# Patient Record
Sex: Male | Born: 1965
Health system: Southern US, Community
[De-identification: ages and names within clinical notes are randomized; demographics above are authoritative.]

## PROBLEM LIST (undated history)

## (undated) HISTORY — PX: CHOLECYSTECTOMY: SHX55

## (undated) HISTORY — PX: KNEE SURGERY: SHX244

## (undated) HISTORY — PX: RENAL BIOPSY, PERCUTANEOUS: SUR144

## (undated) HISTORY — PX: EYE SURGERY: SHX253

---

## 2005-02-28 ENCOUNTER — Encounter: Admission: RE | Admit: 2005-02-28 | Discharge: 2005-02-28 | Payer: Self-pay | Admitting: Family Medicine

## 2008-12-22 ENCOUNTER — Encounter: Admission: RE | Admit: 2008-12-22 | Discharge: 2008-12-22 | Payer: Self-pay | Admitting: Internal Medicine

## 2008-12-29 ENCOUNTER — Encounter: Payer: Self-pay | Admitting: Gastroenterology

## 2008-12-29 ENCOUNTER — Encounter: Admission: RE | Admit: 2008-12-29 | Discharge: 2008-12-29 | Payer: Self-pay | Admitting: Internal Medicine

## 2009-01-03 ENCOUNTER — Encounter: Payer: Self-pay | Admitting: Gastroenterology

## 2009-01-03 ENCOUNTER — Encounter: Admission: RE | Admit: 2009-01-03 | Discharge: 2009-01-03 | Payer: Self-pay | Admitting: Gastroenterology

## 2009-11-14 ENCOUNTER — Encounter: Payer: Self-pay | Admitting: Gastroenterology

## 2009-11-16 ENCOUNTER — Encounter: Admission: RE | Admit: 2009-11-16 | Discharge: 2009-11-16 | Payer: Self-pay | Admitting: Gastroenterology

## 2009-11-28 ENCOUNTER — Encounter: Payer: Self-pay | Admitting: Gastroenterology

## 2009-11-28 ENCOUNTER — Telehealth (INDEPENDENT_AMBULATORY_CARE_PROVIDER_SITE_OTHER): Payer: Self-pay | Admitting: *Deleted

## 2009-11-28 DIAGNOSIS — K861 Other chronic pancreatitis: Secondary | ICD-10-CM

## 2009-12-08 ENCOUNTER — Ambulatory Visit: Payer: Self-pay | Admitting: Gastroenterology

## 2009-12-08 ENCOUNTER — Ambulatory Visit (HOSPITAL_COMMUNITY): Admission: RE | Admit: 2009-12-08 | Discharge: 2009-12-08 | Payer: Self-pay | Admitting: Gastroenterology

## 2010-01-09 ENCOUNTER — Encounter: Payer: Self-pay | Admitting: Gastroenterology

## 2010-03-06 ENCOUNTER — Encounter: Payer: Self-pay | Admitting: Gastroenterology

## 2010-12-18 ENCOUNTER — Encounter: Payer: Self-pay | Admitting: Gastroenterology

## 2010-12-26 NOTE — Letter (Signed)
Summary: [facility]   Imported By: Sherian Rein 02/01/2010 15:15:15  _____________________________________________________________________  External Attachment:    Type:   Image     Comment:   External Document

## 2010-12-26 NOTE — Procedures (Signed)
Summary: Upper Endoscopy  Patient: Jonathan Joseph Note: All result statuses are Final unless otherwise noted.  Tests: (1) Upper Endoscopy (EGD)   EGD Upper Endoscopy       DONE     [facility]     [address] [place], [place]  [address]           ENDOSCOPY PROCEDURE REPORT           PATIENT:  Jonathan, Joseph  MR#:  [id number]     BIRTHDATE:  Mar 12, 1966, 43 yrs. old  GENDER:  male           ENDOSCOPIST:  Rachael Fee, MD     Referred by:  Danise Edge, M.D.           PROCEDURE DATE:  12/08/2009     PROCEDURE:  EGD with endoscopic ultrasound     ASA CLASS:  Class I     INDICATIONS:  recurrent acute pancreatitis; non-drinker, normal GB     on imaging, no stones on MRCP, MRI           MEDICATIONS:  Fentanyl 125 mcg IV, Versed 12.5 mg IV     TOPICAL ANESTHETIC:  none           DESCRIPTION OF PROCEDURE:   After the risks benefits and     alternatives of the procedure were thoroughly explained, informed     consent was obtained.  The  endoscope was introduced through the     mouth and advanced to the second portion of the duodenum, without     limitations.  The instrument was slowly withdrawn as the mucosa     was fully examined.     <<PROCEDUREIMAGES>>           Endoscopic findings (limited views with radial echoendoscope):     1. Normal esophagus     2. Normal stomach     3. Normal duodenum           EUS findings:     1. The pancreatic parenchyma was diffusely more hypoechoic than     usual (edematous) but there were no solid or cystic lesions noted.     2. Main pancreatic duct was normal, however this examination did     not rule out pancreatic divism.     3. CBD was normal, non-dilated and without stones     4. Gallbladder was normal.     5. No peripancreatic or celiac adenopathy.     6. Limited views of liver, spleen, portal and splenic vessels were     all normal.           Impression:     Diffusely hypoechoic pancreatic parenchyma (edematous  pancreas)     without focal solid or cystic lesions.  He carries diagnosis of     hypertriglyceridemia, but tells me that recent lipids were much     improved since he started OTC niacin.  Triglycerides at very high     levels (>1000) could cause pancreatitis.  I will send blood test     for autoimmune pancreatitis (IgG4 level) and if elevated, treat as     such.  If IgG4 is not elevated and triglycerides are felt not to     have caused pancreatitis, would consider cholecystectomy (perhaps     we are not seeing microlithiasis that could cause pancreatitis).     I will communicate these findings, IgG4 levels, recommendations  with Dr. Laural Benes.           ______________________________     Rachael Fee, MD           n.     eSIGNED:   Rachael Fee at 12/08/2009 12:31 PM           Georges Mouse, [id number]  Note: An exclamation mark (!) indicates a result that was not dispersed into the flowsheet. Document Creation Date: 12/08/2009 12:31 PM _______________________________________________________________________  (1) Order result status: Final Collection or observation date-time: 12/08/2009 12:18 Requested date-time:  Receipt date-time:  Reported date-time:  Referring Physician:   Ordering Physician: Rob Bunting [phone]) Specimen Source:  Source: Launa Grill Order Number: [id number] Lab site:

## 2010-12-26 NOTE — Letter (Signed)
Summary: EGD Instructions  [facility]  [address] [place], [place] [address]   Phone: [phone]  Fax: [phone]       Jonathan Joseph    [birth year]    MRN: [id number]       Procedure Day /Date:12/08/2009     Arrival Time: 11 am     Procedure Time:12:00 pm     Location of Procedure:                     [facility] ( Outpatient Registration)   PREPARATION FOR ENDOSCOPY   On 12/08/2009  THE DAY OF THE PROCEDURE:  1.   No solid foods, milk or milk products are allowed after midnight the night before your procedure.  2.   Do not drink anything colored red or purple.  Avoid juices with pulp.  No orange juice.  3.  You may drink clear liquids until 8 am , which is 4 hours before your procedure.                                                                                                CLEAR LIQUIDS INCLUDE: Water Jello Ice Popsicles Tea (sugar ok, no milk/cream) Powdered fruit flavored drinks Coffee (sugar ok, no milk/cream) Gatorade Juice: apple, white grape, white cranberry  Lemonade Clear bullion, consomm, broth Carbonated beverages (any kind) Strained chicken noodle soup Hard Candy   MEDICATION INSTRUCTIONS  Unless otherwise instructed, you should take regular prescription medications with a small sip of water as early as possible the morning of your procedure.  Diabetic patients - see separate instructions.             OTHER INSTRUCTIONS  You will need a responsible adult at least 43 years of age to accompany you and drive you home.   This person must remain in the waiting room during your procedure.  Wear loose fitting clothing that is easily removed.  Leave jewelry and other valuables at home.  However, you may wish to bring a book to read or an iPod/MP3 player to listen to music as you wait for your procedure to start.  Remove all body piercing jewelry and leave at home.  Total time from sign-in until discharge  is approximately 2-3 hours.  You should go home directly after your procedure and rest.  You can resume normal activities the day after your procedure.  The day of your procedure you should not:   Drive   Make legal decisions   Operate machinery   Drink alcohol   Return to work  You will receive specific instructions about eating, activities and medications before you leave.    The above instructions have been reviewed and explained to me by   Chales Abrahams CMA (AAMA)  November 28, 2009 10:02 AM     I fully understand and can verbalize these instructions over the phone mailed to [facility] 11/28/2009

## 2010-12-26 NOTE — Progress Notes (Signed)
Summary: EUS  Phone Note Outgoing Call Call back at [facility] Phone [phone]   Call placed by: Chales Abrahams CMA Duncan Dull),  November 28, 2009 9:59 AM Summary of Call: pt scheduled for EUS 12/08/2009  review meds   left message on machine to call back  Initial call taken by: Chales Abrahams CMA Duncan Dull),  November 28, 2009 10:03 AM  Follow-up for Phone Call        spoke with the pt he has the instructions and will call when he recieves them in the mail for any further questions. Follow-up by: Chales Abrahams CMA (AAMA),  November 28, 2009 11:09 AM  New Problems: CHRONIC PANCREATITIS (ICD-577.1)   New Problems: CHRONIC PANCREATITIS (ICD-577.1)

## 2010-12-26 NOTE — Letter (Signed)
Summary: [facility]   Imported By: Lester Bristol 11/30/2009 10:15:21  _____________________________________________________________________  External Attachment:    Type:   Image     Comment:   External Document

## 2010-12-26 NOTE — Letter (Signed)
Summary: [facility]   Imported By: Lester Wood-Ridge 03/29/2010 11:22:07  _____________________________________________________________________  External Attachment:    Type:   Image     Comment:   External Document

## 2013-12-21 ENCOUNTER — Encounter (HOSPITAL_COMMUNITY): Payer: Self-pay

## 2013-12-22 ENCOUNTER — Ambulatory Visit (HOSPITAL_COMMUNITY): Payer: BC Managed Care – PPO | Attending: Cardiology | Admitting: Radiology

## 2013-12-22 ENCOUNTER — Encounter: Payer: Self-pay | Admitting: Cardiology

## 2013-12-22 VITALS — BP 128/86 | HR 53 | Ht 71.0 in | Wt 174.0 lb

## 2013-12-22 DIAGNOSIS — R079 Chest pain, unspecified: Secondary | ICD-10-CM

## 2013-12-22 DIAGNOSIS — R5383 Other fatigue: Secondary | ICD-10-CM

## 2013-12-22 DIAGNOSIS — R0602 Shortness of breath: Secondary | ICD-10-CM

## 2013-12-22 DIAGNOSIS — R5381 Other malaise: Secondary | ICD-10-CM | POA: Insufficient documentation

## 2013-12-22 DIAGNOSIS — I498 Other specified cardiac arrhythmias: Secondary | ICD-10-CM | POA: Insufficient documentation

## 2013-12-22 DIAGNOSIS — R0989 Other specified symptoms and signs involving the circulatory and respiratory systems: Principal | ICD-10-CM | POA: Insufficient documentation

## 2013-12-22 DIAGNOSIS — R0609 Other forms of dyspnea: Secondary | ICD-10-CM | POA: Insufficient documentation

## 2013-12-22 MED ORDER — TECHNETIUM TC 99M SESTAMIBI GENERIC - CARDIOLITE
10.0000 | Freq: Once | INTRAVENOUS | Status: AC | PRN
  Administered 2013-12-22: 10 via INTRAVENOUS

## 2013-12-22 MED ORDER — TECHNETIUM TC 99M SESTAMIBI GENERIC - CARDIOLITE
30.0000 | Freq: Once | INTRAVENOUS | Status: AC | PRN
  Administered 2013-12-22: 30 via INTRAVENOUS

## 2013-12-22 NOTE — Progress Notes (Signed)
[facility] S[facility] NUCLE[facility] MED [address] [place], [place] [address] [phone]    Cardiology Nuclear Med Study  [name] is a 48 y.o. male     MRN : [id number]     DOB: 12/06/1965  Procedure Date: 12/22/2013  Nuclear Med Background Indication for Stress Test:  Evaluation for Ischemia History:  No known CAD, MPI 2012 (normal) Cardiac Risk Factors: Lipids  Symptoms:  DOE and Fatigue   Nuclear Pre-Procedure Caffeine/Decaff Intake:  None > 12 hrs NPO After: 8:30am   Lungs:  clear O2 Sat: 95% on room air. IV 0.9% NS with Angio Cath:  20g  IV Site: R Antecubital x 1, tolerated well IV Started by:  Irean HongPatsy Edwards, RN  Chest Size (in):  40 Cup Size: n/a  Height: 5\' 11"  (1.803 m)  Weight:  174 lb (78.926 kg)  BMI:  Body mass index is 24.28 kg/(m^2). Tech Comments:  N/A    Nuclear Med Study 1 or 2 day study: 1 day  Stress Test Type:  Stress  Reading MD: N/A  Order Authorizing Provider:  Marden Nobleobert Gates, MD  Resting Radionuclide: Technetium 4768m Sestamibi  Resting Radionuclide Dose: 11.0 mCi   Stress Radionuclide:  Technetium 6968m Sestamibi  Stress Radionuclide Dose: 33.0 mCi           Stress Protocol Rest HR: 53 Stress HR: 162  Rest BP: 128/86 Stress BP: 180/96  Exercise Time (min): 13:00 METS: 15.3           Dose of Adenosine (mg):  n/a Dose of Lexiscan: n/a mg  Dose of Atropine (mg): n/a Dose of Dobutamine: n/a mcg/kg/min (at max HR)  Stress Test Technologist: Broghan Pannone ChimesSharon Brooks, BS-ES  Nuclear Technologist:  Domenic PoliteStephen Carbone, CNMT     Rest Procedure:  Myocardial perfusion imaging was performed at rest 45 minutes following the intravenous administration of Technetium 7368m Sestamibi. Rest ECG: Sinus bradycardia, LVH  Stress Procedure:  The patient exercised on the treadmill utilizing the Bruce Protocol for 13:00 minutes. The patient stopped due to fatigue and denied any chest pain.  Technetium 1968m Sestamibi was injected at peak exercise and myocardial  perfusion imaging was performed after a brief delay. Stress ECG: No significant change from baseline ECG  QPS Raw Data Images:  Normal; no motion artifact; normal heart/lung ratio. Stress Images:  Normal homogeneous uptake in all areas of the myocardium. Rest Images:  Normal homogeneous uptake in all areas of the myocardium. Subtraction (SDS):  No evidence of ischemia. Transient Ischemic Dilatation (Normal <1.22):  0.95 Lung/Heart Ratio (Normal <0.45):  0.35  Quantitative Gated Spect Images QGS EDV:  107 ml QGS ESV:  48 ml  Impression Exercise Capacity:  Excellent exercise capacity. BP Response:  Normal blood pressure response. Clinical Symptoms:  No significant symptoms noted. ECG Impression:  No significant ST segment change suggestive of ischemia. Comparison with Prior Nuclear Study: No significant change from previous study  Overall Impression:  Normal stress nuclear study.  LV Ejection Fraction: 56%.  LV Wall Motion:  NL LV Function; NL Wall Motion  [name], Livia Tarr, Rexene EdisonH 12/22/2013

## 2015-08-11 ENCOUNTER — Other Ambulatory Visit: Payer: Self-pay | Admitting: Internal Medicine

## 2015-08-11 DIAGNOSIS — R1310 Dysphagia, unspecified: Secondary | ICD-10-CM

## 2015-08-15 ENCOUNTER — Ambulatory Visit
Admission: RE | Admit: 2015-08-15 | Discharge: 2015-08-15 | Disposition: A | Discharge status: Home / Self Care | Payer: BLUE CROSS/BLUE SHIELD | Source: Ambulatory Visit | Attending: Internal Medicine | Admitting: Internal Medicine

## 2015-08-15 DIAGNOSIS — R1310 Dysphagia, unspecified: Secondary | ICD-10-CM

## 2015-10-25 ENCOUNTER — Other Ambulatory Visit: Payer: Self-pay | Admitting: Internal Medicine

## 2015-10-25 ENCOUNTER — Ambulatory Visit
Admission: RE | Admit: 2015-10-25 | Discharge: 2015-10-25 | Disposition: A | Discharge status: Home / Self Care | Payer: BLUE CROSS/BLUE SHIELD | Source: Ambulatory Visit | Attending: Internal Medicine | Admitting: Internal Medicine

## 2015-10-25 DIAGNOSIS — M5489 Other dorsalgia: Secondary | ICD-10-CM

## 2015-11-14 ENCOUNTER — Ambulatory Visit
Admission: RE | Admit: 2015-11-14 | Discharge: 2015-11-14 | Disposition: A | Discharge status: Home / Self Care | Payer: BLUE CROSS/BLUE SHIELD | Source: Ambulatory Visit | Attending: Internal Medicine | Admitting: Internal Medicine

## 2015-11-14 ENCOUNTER — Other Ambulatory Visit: Payer: Self-pay | Admitting: Internal Medicine

## 2015-11-14 DIAGNOSIS — M545 Low back pain: Secondary | ICD-10-CM

## 2017-02-09 ENCOUNTER — Emergency Department (HOSPITAL_COMMUNITY)
Admission: EM | Admit: 2017-02-09 | Discharge: 2017-02-10 | Disposition: A | Discharge status: Home / Self Care | Payer: BLUE CROSS/BLUE SHIELD | Attending: Emergency Medicine | Admitting: Emergency Medicine

## 2017-02-09 ENCOUNTER — Encounter (HOSPITAL_COMMUNITY): Payer: Self-pay | Admitting: Emergency Medicine

## 2017-02-09 DIAGNOSIS — Y999 Unspecified external cause status: Secondary | ICD-10-CM | POA: Insufficient documentation

## 2017-02-09 DIAGNOSIS — S8012XA Contusion of left lower leg, initial encounter: Secondary | ICD-10-CM | POA: Insufficient documentation

## 2017-02-09 DIAGNOSIS — Y939 Activity, unspecified: Secondary | ICD-10-CM | POA: Diagnosis not present

## 2017-02-09 DIAGNOSIS — S299XXA Unspecified injury of thorax, initial encounter: Secondary | ICD-10-CM | POA: Diagnosis present

## 2017-02-09 DIAGNOSIS — S2221XA Fracture of manubrium, initial encounter for closed fracture: Secondary | ICD-10-CM | POA: Diagnosis not present

## 2017-02-09 DIAGNOSIS — R935 Abnormal findings on diagnostic imaging of other abdominal regions, including retroperitoneum: Secondary | ICD-10-CM | POA: Insufficient documentation

## 2017-02-09 DIAGNOSIS — Y92410 Unspecified street and highway as the place of occurrence of the external cause: Secondary | ICD-10-CM | POA: Insufficient documentation

## 2017-02-09 LAB — I-STAT CHEM 8, ED
BUN: 16 mg/dL (ref 6–20)
CREATININE: 1.4 mg/dL — AB (ref 0.61–1.24)
Calcium, Ion: 1.09 mmol/L — ABNORMAL LOW (ref 1.15–1.40)
Chloride: 105 mmol/L (ref 101–111)
GLUCOSE: 158 mg/dL — AB (ref 65–99)
HCT: 43 % (ref 39.0–52.0)
HEMOGLOBIN: 14.6 g/dL (ref 13.0–17.0)
POTASSIUM: 3.6 mmol/L (ref 3.5–5.1)
Sodium: 141 mmol/L (ref 135–145)
TCO2: 21 mmol/L (ref 0–100)

## 2017-02-09 MED ORDER — SODIUM CHLORIDE 0.9 % IV BOLUS (SEPSIS)
1000.0000 mL | Freq: Once | INTRAVENOUS | Status: AC
  Administered 2017-02-09: 1000 mL via INTRAVENOUS

## 2017-02-09 MED ORDER — IOPAMIDOL (ISOVUE-300) INJECTION 61%
INTRAVENOUS | Status: AC
  Administered 2017-02-10: 100 mL
  Filled 2017-02-09: qty 100
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
## 2017-02-10 ENCOUNTER — Emergency Department (HOSPITAL_COMMUNITY): Payer: BLUE CROSS/BLUE SHIELD

## 2017-02-10 ENCOUNTER — Encounter (HOSPITAL_COMMUNITY): Payer: Self-pay

## 2017-02-10 LAB — COMPREHENSIVE METABOLIC PANEL
ALBUMIN: 4.1 g/dL (ref 3.5–5.0)
ALT: 40 U/L (ref 17–63)
ANION GAP: 10 (ref 5–15)
AST: 44 U/L — AB (ref 15–41)
Alkaline Phosphatase: 67 U/L (ref 38–126)
BUN: 13 mg/dL (ref 6–20)
CHLORIDE: 106 mmol/L (ref 101–111)
CO2: 20 mmol/L — ABNORMAL LOW (ref 22–32)
CREATININE: 1.43 mg/dL — AB (ref 0.61–1.24)
Calcium: 9.2 mg/dL (ref 8.9–10.3)
GFR, EST NON AFRICAN AMERICAN: 57 mL/min — AB (ref >60)
Glucose, Bld: 158 mg/dL — ABNORMAL HIGH (ref 65–99)
POTASSIUM: 3.6 mmol/L (ref 3.5–5.1)
SODIUM: 136 mmol/L (ref 135–145)
Total Bilirubin: 1.1 mg/dL (ref 0.3–1.2)
Total Protein: 6.9 g/dL (ref 6.5–8.1)

## 2017-02-10 LAB — CBC
HEMATOCRIT: 42.1 % (ref 39.0–52.0)
Hemoglobin: 14.8 g/dL (ref 13.0–17.0)
MCH: 30.1 pg (ref 26.0–34.0)
MCHC: 35.2 g/dL (ref 30.0–36.0)
MCV: 85.6 fL (ref 78.0–100.0)
Platelets: 158 10*3/uL (ref 150–400)
RBC: 4.92 MIL/uL (ref 4.22–5.81)
RDW: 13.1 % (ref 11.5–15.5)
WBC: 11.7 10*3/uL — AB (ref 4.0–10.5)

## 2017-02-10 LAB — CBG MONITORING, ED: GLUCOSE-CAPILLARY: 121 mg/dL — AB (ref 65–99)

## 2017-02-10 MED ORDER — HYDROMORPHONE HCL 1 MG/ML IJ SOLN
1.0000 mg | Freq: Once | INTRAMUSCULAR | Status: AC
  Administered 2017-02-10: 1 mg via INTRAVENOUS
  Filled 2017-02-10: qty 1

## 2017-02-10 MED ORDER — HYDROCODONE-ACETAMINOPHEN 5-325 MG PO TABS
1.0000 | ORAL_TABLET | ORAL | 0 refills | Status: DC | PRN
Start: 2017-02-10 — End: 2017-03-07

## 2017-02-10 MED ORDER — HYDROCODONE-ACETAMINOPHEN 5-325 MG PO TABS
1.0000 | ORAL_TABLET | Freq: Once | ORAL | Status: AC
  Administered 2017-02-10: 1 via ORAL
  Filled 2017-02-10: qty 1

## 2017-02-10 MED ORDER — ONDANSETRON HCL 4 MG/2ML IJ SOLN
4.0000 mg | Freq: Once | INTRAMUSCULAR | Status: AC
Start: 2017-02-10 — End: 2017-02-10
  Administered 2017-02-10: 4 mg via INTRAVENOUS
  Filled 2017-02-10: qty 2

## 2017-02-10 NOTE — ED Notes (Signed)
Nurse tech went to sit pt up, pt became pale, pt was laid back flat. Blood sugar checked, will continue to monitor

## 2017-02-27 ENCOUNTER — Encounter: Payer: BLUE CROSS/BLUE SHIELD | Admitting: Cardiothoracic Surgery

## 2017-03-07 ENCOUNTER — Other Ambulatory Visit: Payer: Self-pay | Admitting: Cardiothoracic Surgery

## 2017-03-07 ENCOUNTER — Institutional Professional Consult (permissible substitution) (INDEPENDENT_AMBULATORY_CARE_PROVIDER_SITE_OTHER): Payer: BLUE CROSS/BLUE SHIELD | Admitting: Cardiothoracic Surgery

## 2017-03-07 ENCOUNTER — Encounter: Payer: Self-pay | Admitting: Cardiothoracic Surgery

## 2017-03-07 VITALS — BP 125/83 | HR 77 | Resp 18 | Ht 72.0 in | Wt 180.0 lb

## 2017-03-07 DIAGNOSIS — I712 Thoracic aortic aneurysm, without rupture, unspecified: Secondary | ICD-10-CM

## 2017-03-07 DIAGNOSIS — Q231 Congenital insufficiency of aortic valve: Secondary | ICD-10-CM

## 2017-03-07 NOTE — Patient Instructions (Signed)
Avoid Cipro or like antibiotics   It's best to avoid activities that cause grunting or straining (medically referred to as a "valsalva maneuver"). This happens when a person bears down against a closed throat to increase the strength of arm or abdominal muscles. There's often a tendency to do this when lifting heavy weights, doing sit-ups, push-ups or chin-ups, etc., but it may be harmful.     Thoracic Aortic Aneurysm An aneurysm is a bulge in an artery. It happens when blood pushes up against a weakened or damaged artery wall. A thoracic aortic aneurysm is an aneurysm that occurs in the first part of the aorta, between the heart and the diaphragm. The aorta is the main artery of the body. It supplies blood from the heart to the rest of the body. Some aneurysms may not cause symptoms or problems. However, the major concern with a thoracic aortic aneurysm is that it can enlarge and burst (rupture), or blood can flow between the layers of the wall of the aorta through a tear (aorticdissection). Both of these conditions can cause bleeding inside the body and can be life-threatening if they are not diagnosed and treated right away. What are the causes? The exact cause of this condition is not known. What increases the risk? The following factors may make you more likely to develop this condition:  Being age 32 or older.  Having a hardening of the arteries caused by the buildup of fat and other substances in the lining of a blood vessel (arteriosclerosis).  Having inflammation of the walls of an artery (arteritis).  Having a genetic disease that weakens the body's connective tissue, such as Marfan syndrome.  Having an injury or trauma to the aorta.  Having an infection that is caused by bacteria, such as syphilis or staphylococcus, in the wall of the aorta (infectious aortitis).  Having high blood pressure (hypertension).  Being male.  Being white (Caucasian).  Having high  cholesterol.  Having a family history of aneurysms.  Using tobacco.  Having chronic obstructive pulmonary disease (COPD). What are the signs or symptoms? Symptoms of this condition vary depending on the size and rate of growth of the aneurysm. Most grow slowly and do not cause any symptoms. When symptoms do occur, they may include:  Pain in the chest, back, sides, or abdomen. The pain may vary in intensity. A sudden onset of severe pain may indicate that the aneurysm has ruptured.  Hoarseness.  Cough.  Shortness of breath.  Swallowing problems.  Swelling in the face, arms, or legs.  Fever.  Unexplained weight loss. How is this diagnosed? This condition may be diagnosed with:  An ultrasound.  X-rays.  A CT scan.  An MRI.  Tests to check the arteries for damage or blockages (angiogram). Most unruptured thoracic aortic aneurysms cause no symptoms, so they are often found during exams for other conditions. How is this treated? Treatment for this condition depends on:  The size of the aneurysm.  How fast the aneurysm is growing.  Your age.  Risk factors for rupture. Aneurysms that are smaller than 2.2 inches (5.5 cm) may be managed by using medicines to control blood pressure, manage pain, or fight infection. You may need regular monitoring to see if the aneurysm is getting bigger. Your health care provider may recommend that you have an ultrasound every year or every 6 months. How often you need to have an ultrasound depends on the size of the aneurysm, how fast it is growing, and  whether you have a family history of aneurysms. Surgical repair may be needed if your aneurysm is larger than 2.2 inches or if it is growing quickly. Follow these instructions at home: Eating and drinking   Eat a healthy diet. Your health care provider may recommend that you:  Lower your salt (sodium) intake. In some people, too much salt can raise blood pressure and increase the risk of  thoracic aortic aneurysm.  Avoid foods that are high in saturated fat and cholesterol, such as red meat and dairy.  Eat a diet that is low in sugar.  Increase your fiber intake by including whole grains, vegetables, and fruits in your diet. Eating these foods may help to lower blood pressure.  Limit or avoid alcohol as recommended by your health care provider. Lifestyle   Follow instructions from your health care provider about healthy lifestyle habits. Your health care provider may recommend that you:  Do not use any products that contain nicotine or tobacco, such as cigarettes and e-cigarettes. If you need help quitting, ask your health care provider.  Keep your blood pressure within normal limits. The target limit for most people is below 120/80. Check your blood pressure regularly. If it is high, ask your health care provider about ways that you can control it.  Keep your blood sugar (glucose) level and cholesterol levels within normal limits. Target limits for most people are:  Blood glucose level: Less than 100 mg/dL.  Total cholesterol level: Less than 200 mg/dL.  Maintain a healthy weight. Activity   Stay physically active and exercise regularly. Talk with your health care provider about how often you should exercise and ask which types of exercise are safe for you.  Avoid heavy lifting and activities that take a lot of effort (are strenuous). Ask your health care provider what activities are safe for you. General instructions   Keep all follow-up visits as told by your health care provider. This is important.  Talk with your health care provider about regular screenings to see if the aneurysm is getting bigger.  Take over-the-counter and prescription medicines only as told by your health care provider. Contact a health care provider if:  You have discomfort in your upper back, neck, or abdomen.  You have trouble swallowing.  You have a cough or hoarseness.  You  have a family history of aneurysms.  You have unexplained weight loss. Get help right away if:  You have sudden, severe pain in your upper back and abdomen. This pain may move into your chest and arms.  You have shortness of breath.  You have a fever. This information is not intended to replace advice given to you by your health care provider. Make sure you discuss any questions you have with your health care provider. Document Released: 11/12/2005 Document Revised: 08/24/2016 Document Reviewed: 08/24/2016 Elsevier Interactive Patient Education  2017 [employer]   Aortic Dissection An aortic dissection happens when there is a tear in the main blood vessel of the body (aorta). The aorta comes out of the heart, curves around, and then goes down the chest (thoracic aorta) and into the abdomen (abdominal aorta) to supply arteries with blood. The wall of the aorta has inner and outer layers. Aortic dissection occurs most often in the thoracic aorta. As the tear widens and blood flows through it, the aorta becomes "double-barreled." This means that one part of the aorta continues to carry blood to the body, but blood also flows into the tear, between  the layers of the aorta. The torn part of the aorta fills with blood and swells up. This can reduce blood flow through the part of the aorta that is still supplying blood to the body. Aortic dissection is a medical emergency. What are the causes? An aortic dissection is commonly caused by weakening of the artery wall due to high blood pressure. Other causes may include:  An injury, such as from a car crash.  Birth defects that affect the heart (congenital heart defects).  Thickening of the artery walls. In some cases, the cause is not known. What increases the risk? The following factors may make you more likely to develop this condition:  Having certain medical conditions, such as:  High blood pressure (hypertension).  Hardening and  narrowing of the arteries (atherosclerosis).  A genetic disorder that affects the connective tissue, such as Marfan syndrome or Ehlers-Danlos syndrome.  A condition that causes inflammation of blood vessels, such as giant cell arteritis.  Having a chest injury.  Having surgery on the aorta.  Being born with a congenital heart defect.  Being male.  Being older than age 69.  Using cocaine.  Smoking.  Lifting heavy weights or doing other types of high-intensity resistance training. What are the signs or symptoms? Signs and symptoms of aortic dissection start suddenly. The most common symptoms are:  Severe chest pain that may feel like tearing, stabbing, or sharp pain.  Severe pain that spreads (radiates) to the back, neck, jaw, or abdomen. Other symptoms may include:  Trouble breathing.  Dizziness or fainting.  Sudden weakness on one side of the body.  Nausea or vomiting.  Trouble swallowing.  Coughing up blood.  Vomiting blood.  Clammy skin. How is this diagnosed? This condition may be diagnosed based on:  Your symptoms.  A physical exam. This may include:  Listening for abnormal blood flow sounds (murmurs) in your chest or abdomen.  Checking your pulse in your arms and legs.  Checking your blood pressure to see whether it is low or whether there is a difference between the measurements in your right and left arm.  Electrocardiogram (ECG). This test measures the electrical activity in your heart.  Chest X-ray.  CT scan.  MRI.  Aortic angiogram. This test involves injecting dye to make it easier to see your blood vessels clearly.  Echocardiogram to study your heart using sound waves.  Blood tests. How is this treated? It is important to treat an aortic dissection as quickly as possible. Treatment may start as soon as your health care provider thinks that you have aortic dissection. Treatment depends on the location and severity of your dissection  and your overall health. Treatment may include:  Medicines to lower your blood pressure.  Surgery to repair the dissected part of your aorta with artificial material (syntheticgraft).  A medical procedure to insert a stent-graft into the aorta (endovascular procedure). During this procedure, a long, thin tube (stent) is inserted into an artery near the groin (femoral artery) and moved up to the damaged part of the aorta. Then, the stent is opened to help improve blood flow and prevent future dissection. Follow these instructions at home: Activity   Avoid activities that could injure your chest or your abdomen. Ask your health care provider what activities are safe for you.  After you have recovered, try to stay active. Ask your health care provider what activities are safe for you after recovery.  Do not lift anything that is heavier than 10  lb (4.5 kg) until your health care provider approves.  Do not drive or use heavy machinery while taking prescription pain medicine. Eating and drinking   Eat a heart-healthy diet, which includes lots of fresh fruits and vegetables, low-fat (lean) protein, and whole grains.  Check ingredients and nutrition facts on packaged foods and beverages, and avoid foods with high amounts of:  Salt (sodium).  Saturated fats (like red meat).  Trans fats (like fried food). General instructions   Take over-the-counter and prescription medicines only as told by your health care provider.  Work with your health care provider to manage your blood pressure.  Talk with your health care provider about how to manage stress.  Do not use any products that contain nicotine or tobacco, such as cigarettes and e-cigarettes. If you need help quitting, ask your health care provider.  Keep all follow-up visits as told by your health care provider. This is important. Get help right away if:  You develop any symptoms of aortic dissection after treatment, including severe  pain in your chest, back, or abdomen.  You have a pain in your abdomen.  You have trouble breathing or you develop a cough.  You faint.  You develop a racing heartbeat. These symptoms may represent a serious problem that is an emergency. Do not wait to see if the symptoms will go away. Get medical help right away. Call your local emergency services (911 in the [place] Do not drive yourself to the hospital. Summary  An aortic dissection happens when there is a tear in the main blood vessel of the body (aorta). It is a medical emergency.  The most common symptom is severe pain in the chest that spreads (radiates) to the back, neck, jaw, or abdomen.  It is important to treat an aortic dissection as quickly as possible. Treatment typically includes surgery and medicines. This information is not intended to replace advice given to you by your health care provider. Make sure you discuss any questions you have with your health care provider. Document Released: 02/19/2008 Document Revised: 10/01/2016 Document Reviewed: 10/01/2016 Elsevier Interactive Patient Education  2017 [employer].
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
## 2017-03-11 ENCOUNTER — Encounter: Payer: Self-pay | Admitting: Cardiothoracic Surgery

## 2017-03-14 ENCOUNTER — Other Ambulatory Visit (HOSPITAL_COMMUNITY): Payer: BLUE CROSS/BLUE SHIELD

## 2017-03-19 ENCOUNTER — Ambulatory Visit (HOSPITAL_COMMUNITY)
Admission: RE | Admit: 2017-03-19 | Discharge: 2017-03-19 | Disposition: A | Discharge status: Home / Self Care | Payer: BLUE CROSS/BLUE SHIELD | Source: Ambulatory Visit | Attending: Cardiothoracic Surgery | Admitting: Cardiothoracic Surgery

## 2017-03-19 DIAGNOSIS — Q231 Congenital insufficiency of aortic valve: Secondary | ICD-10-CM | POA: Diagnosis not present

## 2017-03-19 LAB — ECHOCARDIOGRAM COMPLETE
E decel time: 290 msec
E/e' ratio: 7.52
FS: 33 % (ref 28–44)
IVS/LV PW RATIO, ED: 0.87
LA ID, A-P, ES: 35 mm
LA diam end sys: 35 mm
LA diam index: 1.72 cm/m2
LA vol A4C: 39.4 ml
LA vol index: 21 mL/m2
LA vol: 42.8 mL
LV E/e' medial: 7.52
LV E/e'average: 7.52
LV PW d: 10.8 mm — AB (ref 0.6–1.1)
LV e' LATERAL: 8.38 cm/s
LVOT SV: 74 mL
LVOT VTI: 21.3 cm
LVOT area: 3.46 cm2
LVOT diameter: 21 mm
LVOT peak grad rest: 3 mmHg
LVOT peak vel: 90.1 cm/s
Lateral S' vel: 14.8 cm/s
MV Dec: 290
MV pk A vel: 59.6 m/s
MV pk E vel: 63 m/s
TAPSE: 24.4 mm
TDI e' lateral: 8.38
TDI e' medial: 7.02

## 2017-03-19 NOTE — Progress Notes (Signed)
  Echocardiogram 2D Echocardiogram has been performed.  Jonathan Joseph 03/19/2017, 8:32 AM

## 2017-12-27 ENCOUNTER — Other Ambulatory Visit: Payer: Self-pay | Admitting: Internal Medicine

## 2017-12-27 ENCOUNTER — Ambulatory Visit
Admission: RE | Admit: 2017-12-27 | Discharge: 2017-12-27 | Disposition: A | Discharge status: Home / Self Care | Payer: BLUE CROSS/BLUE SHIELD | Source: Ambulatory Visit | Attending: Internal Medicine | Admitting: Internal Medicine

## 2017-12-27 DIAGNOSIS — R635 Abnormal weight gain: Secondary | ICD-10-CM | POA: Diagnosis not present

## 2017-12-27 DIAGNOSIS — J40 Bronchitis, not specified as acute or chronic: Secondary | ICD-10-CM

## 2017-12-27 DIAGNOSIS — J209 Acute bronchitis, unspecified: Secondary | ICD-10-CM | POA: Diagnosis not present

## 2017-12-27 DIAGNOSIS — R05 Cough: Secondary | ICD-10-CM | POA: Diagnosis not present

## 2018-01-28 ENCOUNTER — Encounter: Payer: Self-pay | Admitting: Podiatry

## 2018-01-28 ENCOUNTER — Ambulatory Visit (INDEPENDENT_AMBULATORY_CARE_PROVIDER_SITE_OTHER): Payer: BLUE CROSS/BLUE SHIELD

## 2018-01-28 ENCOUNTER — Ambulatory Visit: Payer: BLUE CROSS/BLUE SHIELD | Admitting: Podiatry

## 2018-01-28 VITALS — Ht 72.0 in | Wt 195.0 lb

## 2018-01-28 DIAGNOSIS — M79672 Pain in left foot: Secondary | ICD-10-CM

## 2018-01-28 DIAGNOSIS — M722 Plantar fascial fibromatosis: Secondary | ICD-10-CM

## 2018-01-28 MED ORDER — MELOXICAM 15 MG PO TABS: 15.0000 mg | ORAL_TABLET | Freq: Every day | ORAL | 0 refills | Status: DC

## 2018-01-28 NOTE — Progress Notes (Signed)
Subjective:    Patient ID: Jonathan Joseph, male    DOB: [birth year], 52 y.o.   MRN: [id number]  HPI  Chief Complaint  Patient presents with  . Foot Pain    Left heel pain, acute x 1 month -    Jonathan Joseph presents the office today with concerns of left heel pain which is been ongoing for about 2 months.  He states this started without obvious keep foods.  He denies any recent injury or trauma when the heel started there was a gradual onset.  He denies any swelling or redness.  He states he gets pain in the morning to the bottom of his heel when he first gets up and gets better with walking.  He has tried over-the-counter inserts, change in shoes as well as icing.  He also has a night splint at home.  He has no numbness or tingling in the pain does not wake him up at night.  He has no other concerns today.    Review of Systems  Musculoskeletal: Positive for gait problem.  All other systems reviewed and are negative.  History reviewed. No pertinent past medical history.  Past Surgical History:  Procedure Laterality Date  . CHOLECYSTECTOMY    . EYE SURGERY    . KNEE SURGERY    . RENAL BIOPSY, PERCUTANEOUS       Current Outpatient Medications:  .  aspirin EC 81 MG tablet, Take 81 mg by mouth daily., Disp: , Rfl:  .  esomeprazole (NEXIUM) 20 MG capsule, Take 20 mg by mouth. EVERY OTHER DAY, Disp: , Rfl:  .  loratadine (CLARITIN) 10 MG tablet, Take 10 mg by mouth daily., Disp: , Rfl:  .  meloxicam (MOBIC) 15 MG tablet, Take 1 tablet (15 mg total) by mouth daily., Disp: 30 tablet, Rfl: 0 .  rosuvastatin (CRESTOR) 10 MG tablet, Take 10 mg by mouth. EVERY OTHER DAY, Disp: , Rfl:   Allergies  Allergen Reactions  . Penicillins Hives  . Tetracyclines & Related Hives    Social History   Socioeconomic History  . Marital status: Married    Spouse name: Not on file  . Number of children: Not on file  . Years of education: Not on file  . Highest education level: Not on file  Social  Needs  . Financial resource strain: Not on file  . Food insecurity - worry: Not on file  . Food insecurity - inability: Not on file  . Transportation needs - medical: Not on file  . Transportation needs - non-medical: Not on file  Occupational History  . Not on file  Tobacco Use  . Smoking status: Never Smoker  . Smokeless tobacco: Never Used  Substance and Sexual Activity  . Alcohol use: No  . Drug use: No  . Sexual activity: Not on file  Other Topics Concern  . Not on file  Social History Narrative  . Not on file       Objective:   Physical Exam General: AAO x3, NAD  Dermatological: Skin is warm, dry and supple bilateral. Nails x 10 are well manicured; remaining integument appears unremarkable at this time. There are no open sores, no preulcerative lesions, no rash or signs of infection present.  Vascular: Dorsalis Pedis artery and Posterior Tibial artery pedal pulses are 2/4 bilateral with immedate capillary fill time. Pedal hair growth present. No varicosities and no lower extremity edema present bilateral. There is no pain with calf compression, swelling, warmth, erythema.  Neruologic: Grossly intact via light touch bilateral. Protective threshold with Semmes Wienstein monofilament intact to all pedal sites bilateral.  Negative Tinel sign  Musculoskeletal: Tenderness to palpation along the plantar medial tubercle of the calcaneus at the insertion of plantar fascia on the left foot. There is no pain along the course of the plantar fascia within the arch of the foot. Plantar fascia appears to be intact. There is no pain with lateral compression of the calcaneus or pain with vibratory sensation. There is no pain along the course or insertion of the achilles tendon. No other areas of tenderness to bilateral lower extremities. Muscular strength 5/5 in all groups tested bilateral.  Gait: Unassisted, Nonantalgic.     Assessment & Plan:  52 year old male left heel pain likely  plantar fasciitis -Treatment options discussed including all alternatives, risks, and complications -Etiology of symptoms were discussed -X-rays were obtained and reviewed with the patient.  There is no evidence of acute fracture or stress fracture identified today. -Steroid injections performed today.  See procedure note below. -Prescribed mobic. Discussed side effects of the medication and directed to stop if any are to occur and call the office.  -Plantar fascial brace dispensed  -Stretching, icing exercises daily. -Continue shoe modifications and inserts -Follow-up in 3 weeks or sooner if any issues are to arise.  Call any questions or concerns.  He agrees with this plan.  Procedure: Injection Tendon/Ligament Discussed alternatives, risks, complications and verbal consent was obtained.  Location: Left plantar fascia at the glabrous junction; medial approach. Skin Prep: Alcohol. Injectate: 0.5 cc 0.5% marcaine plain, 0.5 cc 0.5% Marcaine plain and, 1 cc kenalog 10. Disposition: Patient tolerated procedure well. Injection site dressed with a band-aid.  Post-injection care was discussed and return precautions discussed.   Jonathan Joseph DPM

## 2018-01-28 NOTE — Patient Instructions (Signed)

## 2018-01-30 DIAGNOSIS — M722 Plantar fascial fibromatosis: Secondary | ICD-10-CM | POA: Insufficient documentation

## 2018-02-07 DIAGNOSIS — G933 Postviral fatigue syndrome: Secondary | ICD-10-CM | POA: Diagnosis not present

## 2018-02-07 DIAGNOSIS — R05 Cough: Secondary | ICD-10-CM | POA: Diagnosis not present

## 2018-02-13 ENCOUNTER — Other Ambulatory Visit: Payer: Self-pay | Admitting: Cardiothoracic Surgery

## 2018-02-13 DIAGNOSIS — I712 Thoracic aortic aneurysm, without rupture, unspecified: Secondary | ICD-10-CM

## 2018-02-24 ENCOUNTER — Other Ambulatory Visit: Payer: Self-pay | Admitting: Podiatry

## 2018-02-24 NOTE — Telephone Encounter (Signed)
Pt needs an appt prior to future refills. 

## 2018-03-08 DIAGNOSIS — J302 Other seasonal allergic rhinitis: Secondary | ICD-10-CM | POA: Diagnosis not present

## 2018-03-08 DIAGNOSIS — I10 Essential (primary) hypertension: Secondary | ICD-10-CM | POA: Diagnosis not present

## 2018-03-11 DIAGNOSIS — K210 Gastro-esophageal reflux disease with esophagitis: Secondary | ICD-10-CM | POA: Diagnosis not present

## 2018-03-11 DIAGNOSIS — I712 Thoracic aortic aneurysm, without rupture: Secondary | ICD-10-CM | POA: Diagnosis not present

## 2018-03-11 DIAGNOSIS — I10 Essential (primary) hypertension: Secondary | ICD-10-CM | POA: Diagnosis not present

## 2018-03-26 ENCOUNTER — Ambulatory Visit: Payer: BLUE CROSS/BLUE SHIELD | Admitting: Podiatry

## 2018-03-26 ENCOUNTER — Encounter: Payer: Self-pay | Admitting: Podiatry

## 2018-03-26 DIAGNOSIS — M722 Plantar fascial fibromatosis: Secondary | ICD-10-CM | POA: Diagnosis not present

## 2018-03-26 MED ORDER — METHYLPREDNISOLONE 4 MG PO TBPK: ORAL_TABLET | ORAL | 0 refills | Status: DC

## 2018-03-27 ENCOUNTER — Other Ambulatory Visit: Payer: Self-pay | Admitting: Podiatry

## 2018-03-30 NOTE — Progress Notes (Signed)
Subjective: Jonathan Joseph presents the office today for follow-up evaluation of left heel pain, plantar fasciitis.  He states the pain on the inside is doing much better but is getting pain more to the bottom outside aspect of the heel.  He denies any recent injury or trauma and he has been trying to stretch and ice as much as possible.  No swelling or redness.  He has no other concerns. Denies any systemic complaints such as fevers, chills, nausea, vomiting. No acute changes since last appointment, and no other complaints at this time.   Objective: AAO x3, NAD DP/PT pulses palpable bilaterally, CRT less than 3 seconds There is tenderness to palpation along the plantar LATERAL tubercle of the calcaneus at the insertion of plantar fascia on the left foot. There is no pain along the course of the plantar fascia within the arch of the foot. Plantar fascia appears to be intact. There is no pain with lateral compression of the calcaneus or pain with vibratory sensation. There is no pain along the course or insertion of the achilles tendon. No other areas of tenderness to bilateral lower extremities. No open lesions or pre-ulcerative lesions.  No pain with calf compression, swelling, warmth, erythema  Assessment: Left heel pain, plantar fasciitis  Plan: -All treatment options discussed with the patient including all alternatives, risks, complications.  -Today steroid injections performed in the lateral approach.  See procedure note below. -Medrol Dosepak was prescribed -Continue stretching, icing exercises daily as well as supportive shoes discussed orthotics -Patient encouraged to call the office with any questions, concerns, change in symptoms.  -RTC 3 weeks if symptoms continue or sooner if needed  Procedure: Injection Tendon/Ligament Discussed alternatives, risks, complications and verbal consent was obtained.  Location: Left plantar fascia at the glabrous junction; LATERAL approach. Skin Prep:  Alcohol. Injectate: 0.5cc 0.5% marcaine plain, 0.5 cc 2% lidocaine plain and, 1 cc kenalog 10. Disposition: Patient tolerated procedure well. Injection site dressed with a band-aid.  Post-injection care was discussed and return precautions discussed.    Vivi Barrack DPM

## 2018-04-03 ENCOUNTER — Ambulatory Visit: Payer: BLUE CROSS/BLUE SHIELD | Admitting: Cardiothoracic Surgery

## 2018-04-03 ENCOUNTER — Other Ambulatory Visit: Payer: Self-pay

## 2018-04-03 ENCOUNTER — Encounter: Payer: Self-pay | Admitting: Cardiothoracic Surgery

## 2018-04-03 ENCOUNTER — Ambulatory Visit
Admission: RE | Admit: 2018-04-03 | Discharge: 2018-04-03 | Disposition: A | Discharge status: Home / Self Care | Payer: BLUE CROSS/BLUE SHIELD | Source: Ambulatory Visit | Attending: Cardiothoracic Surgery | Admitting: Cardiothoracic Surgery

## 2018-04-03 VITALS — BP 119/88 | HR 77 | Resp 18 | Ht 72.0 in | Wt 195.6 lb

## 2018-04-03 DIAGNOSIS — I712 Thoracic aortic aneurysm, without rupture, unspecified: Secondary | ICD-10-CM

## 2018-04-03 DIAGNOSIS — Q231 Congenital insufficiency of aortic valve: Secondary | ICD-10-CM | POA: Diagnosis not present

## 2018-04-03 DIAGNOSIS — I719 Aortic aneurysm of unspecified site, without rupture: Secondary | ICD-10-CM | POA: Insufficient documentation

## 2018-04-03 MED ORDER — IOPAMIDOL (ISOVUE-370) INJECTION 76%
75.0000 mL | Freq: Once | INTRAVENOUS | Status: AC | PRN
  Administered 2018-04-03: 75 mL via INTRAVENOUS

## 2018-04-03 NOTE — Patient Instructions (Signed)
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
## 2018-04-10 DIAGNOSIS — G478 Other sleep disorders: Secondary | ICD-10-CM | POA: Diagnosis not present

## 2018-04-10 DIAGNOSIS — I712 Thoracic aortic aneurysm, without rupture: Secondary | ICD-10-CM | POA: Diagnosis not present

## 2018-04-10 DIAGNOSIS — R5383 Other fatigue: Secondary | ICD-10-CM | POA: Diagnosis not present

## 2018-04-10 DIAGNOSIS — I10 Essential (primary) hypertension: Secondary | ICD-10-CM | POA: Diagnosis not present

## 2018-04-10 DIAGNOSIS — Z79899 Other long term (current) drug therapy: Secondary | ICD-10-CM | POA: Diagnosis not present

## 2018-05-01 DIAGNOSIS — G4733 Obstructive sleep apnea (adult) (pediatric): Secondary | ICD-10-CM | POA: Diagnosis not present

## 2018-05-12 ENCOUNTER — Encounter (INDEPENDENT_AMBULATORY_CARE_PROVIDER_SITE_OTHER): Payer: Self-pay

## 2018-05-12 ENCOUNTER — Encounter: Payer: Self-pay | Admitting: Podiatry

## 2018-05-12 ENCOUNTER — Encounter

## 2018-05-12 ENCOUNTER — Ambulatory Visit: Payer: BLUE CROSS/BLUE SHIELD | Admitting: Podiatry

## 2018-05-12 DIAGNOSIS — M722 Plantar fascial fibromatosis: Secondary | ICD-10-CM

## 2018-05-12 MED ORDER — TRIAMCINOLONE ACETONIDE 10 MG/ML IJ SUSP
10.0000 mg | Freq: Once | INTRAMUSCULAR | Status: AC
  Administered 2021-12-13: 10 mg

## 2018-05-13 DIAGNOSIS — G4733 Obstructive sleep apnea (adult) (pediatric): Secondary | ICD-10-CM | POA: Diagnosis not present

## 2018-05-13 NOTE — Progress Notes (Signed)
Subjective: 52 year old male presents the office today for evaluation of the left heel pain, plantar fasciitis.  He states that he did not take the Medrol Dosepak.  Given his history of kidney problems he only took the anti-inflammatories for 1 week he has not been taking any anti-inflammatories.  He does feel that the injections to help.  Denies any recent injury or trauma.  He is interested in dry needling because he talked to a friend at this time it was helpful. Denies any systemic complaints such as fevers, chills, nausea, vomiting. No acute changes since last appointment, and no other complaints at this time.   Objective: AAO x3, NAD DP/PT pulses palpable bilaterally, CRT less than 3 seconds There is continuation of tenderness on the plantar medial tubercle of the calcaneus and insertion of plantar fashion the left side.  Also mild discomfort on the plantar lateral aspect.  There is no pain with lateral compression of calcaneus.  No edema, erythema, increase in warmth.  There is no pain on the Achilles tendon which also appears to be intact.  Plantar fascia appears to be intact.  No open lesions or pre-ulcerative lesions.  No pain with calf compression, swelling, warmth, erythema  Assessment: 52 year old male with left heel pain  Plan: -All treatment options discussed with the patient including all alternatives, risks, complications.  -Today steroid injection was performed.  This is his 3rd steroid injection. -Prescription for physical therapy was written for [facility] to also include dry needling. -Technique stretching, icing exercises daily.  Continue with night splint.  Continue with supportive shoes and also discussed orthotics again today. -Patient encouraged to call the office with any questions, concerns, change in symptoms.   Procedure: Injection Tendon/Ligament Discussed alternatives, risks, complications and verbal consent was obtained.  Location: Right plantar  fascia at the glabrous junction; medial approach. Skin Prep: Alcohol. Injectate: 0.5cc 0.5% marcaine plain, 0.5 cc 2% lidocaine plain and, 1 cc kenalog 10. Disposition: Patient tolerated procedure well. Injection site dressed with a band-aid.  Post-injection care was discussed and return precautions discussed.   Vivi BarrackMatthew R Wagoner DPM

## 2018-05-13 NOTE — Addendum Note (Signed)
Addended by: Alphia Kava'CONNELL, VALERY D on: 05/13/2018 09:23 AM   Modules accepted: Orders

## 2018-05-15 DIAGNOSIS — I712 Thoracic aortic aneurysm, without rupture: Secondary | ICD-10-CM | POA: Diagnosis not present

## 2018-05-15 DIAGNOSIS — I10 Essential (primary) hypertension: Secondary | ICD-10-CM | POA: Diagnosis not present

## 2018-05-15 DIAGNOSIS — G4733 Obstructive sleep apnea (adult) (pediatric): Secondary | ICD-10-CM | POA: Diagnosis not present

## 2018-05-16 DIAGNOSIS — G4733 Obstructive sleep apnea (adult) (pediatric): Secondary | ICD-10-CM | POA: Diagnosis not present

## 2018-05-19 DIAGNOSIS — I10 Essential (primary) hypertension: Secondary | ICD-10-CM | POA: Diagnosis not present

## 2018-05-19 DIAGNOSIS — M25672 Stiffness of left ankle, not elsewhere classified: Secondary | ICD-10-CM | POA: Diagnosis not present

## 2018-05-19 DIAGNOSIS — R269 Unspecified abnormalities of gait and mobility: Secondary | ICD-10-CM | POA: Diagnosis not present

## 2018-05-19 DIAGNOSIS — M79672 Pain in left foot: Secondary | ICD-10-CM | POA: Diagnosis not present

## 2018-05-21 DIAGNOSIS — I10 Essential (primary) hypertension: Secondary | ICD-10-CM | POA: Diagnosis not present

## 2018-05-21 DIAGNOSIS — M25672 Stiffness of left ankle, not elsewhere classified: Secondary | ICD-10-CM | POA: Diagnosis not present

## 2018-05-21 DIAGNOSIS — M79672 Pain in left foot: Secondary | ICD-10-CM | POA: Diagnosis not present

## 2018-05-21 DIAGNOSIS — R269 Unspecified abnormalities of gait and mobility: Secondary | ICD-10-CM | POA: Diagnosis not present

## 2018-05-26 DIAGNOSIS — R269 Unspecified abnormalities of gait and mobility: Secondary | ICD-10-CM | POA: Diagnosis not present

## 2018-05-26 DIAGNOSIS — M79672 Pain in left foot: Secondary | ICD-10-CM | POA: Diagnosis not present

## 2018-05-26 DIAGNOSIS — I10 Essential (primary) hypertension: Secondary | ICD-10-CM | POA: Diagnosis not present

## 2018-05-26 DIAGNOSIS — M25672 Stiffness of left ankle, not elsewhere classified: Secondary | ICD-10-CM | POA: Diagnosis not present

## 2018-05-27 DIAGNOSIS — M25672 Stiffness of left ankle, not elsewhere classified: Secondary | ICD-10-CM | POA: Diagnosis not present

## 2018-05-27 DIAGNOSIS — M79672 Pain in left foot: Secondary | ICD-10-CM | POA: Diagnosis not present

## 2018-05-27 DIAGNOSIS — R269 Unspecified abnormalities of gait and mobility: Secondary | ICD-10-CM | POA: Diagnosis not present

## 2018-05-27 DIAGNOSIS — I10 Essential (primary) hypertension: Secondary | ICD-10-CM | POA: Diagnosis not present

## 2018-06-03 DIAGNOSIS — I10 Essential (primary) hypertension: Secondary | ICD-10-CM | POA: Diagnosis not present

## 2018-06-03 DIAGNOSIS — M25672 Stiffness of left ankle, not elsewhere classified: Secondary | ICD-10-CM | POA: Diagnosis not present

## 2018-06-03 DIAGNOSIS — R269 Unspecified abnormalities of gait and mobility: Secondary | ICD-10-CM | POA: Diagnosis not present

## 2018-06-03 DIAGNOSIS — M79672 Pain in left foot: Secondary | ICD-10-CM | POA: Diagnosis not present

## 2018-06-05 DIAGNOSIS — I10 Essential (primary) hypertension: Secondary | ICD-10-CM | POA: Diagnosis not present

## 2018-06-05 DIAGNOSIS — M25672 Stiffness of left ankle, not elsewhere classified: Secondary | ICD-10-CM | POA: Diagnosis not present

## 2018-06-05 DIAGNOSIS — R269 Unspecified abnormalities of gait and mobility: Secondary | ICD-10-CM | POA: Diagnosis not present

## 2018-06-05 DIAGNOSIS — M79672 Pain in left foot: Secondary | ICD-10-CM | POA: Diagnosis not present

## 2018-06-12 DIAGNOSIS — G4733 Obstructive sleep apnea (adult) (pediatric): Secondary | ICD-10-CM | POA: Diagnosis not present

## 2018-06-17 ENCOUNTER — Ambulatory Visit: Payer: BLUE CROSS/BLUE SHIELD | Admitting: Podiatry

## 2018-06-17 DIAGNOSIS — M25672 Stiffness of left ankle, not elsewhere classified: Secondary | ICD-10-CM | POA: Diagnosis not present

## 2018-06-17 DIAGNOSIS — R269 Unspecified abnormalities of gait and mobility: Secondary | ICD-10-CM | POA: Diagnosis not present

## 2018-06-17 DIAGNOSIS — M79672 Pain in left foot: Secondary | ICD-10-CM | POA: Diagnosis not present

## 2018-06-17 DIAGNOSIS — I10 Essential (primary) hypertension: Secondary | ICD-10-CM | POA: Diagnosis not present

## 2018-06-19 DIAGNOSIS — M79672 Pain in left foot: Secondary | ICD-10-CM | POA: Diagnosis not present

## 2018-06-19 DIAGNOSIS — R269 Unspecified abnormalities of gait and mobility: Secondary | ICD-10-CM | POA: Diagnosis not present

## 2018-06-19 DIAGNOSIS — M25672 Stiffness of left ankle, not elsewhere classified: Secondary | ICD-10-CM | POA: Diagnosis not present

## 2018-06-19 DIAGNOSIS — I10 Essential (primary) hypertension: Secondary | ICD-10-CM | POA: Diagnosis not present

## 2018-06-20 ENCOUNTER — Ambulatory Visit: Payer: BLUE CROSS/BLUE SHIELD | Admitting: Podiatry

## 2018-06-20 ENCOUNTER — Telehealth: Payer: Self-pay | Admitting: *Deleted

## 2018-06-20 ENCOUNTER — Ambulatory Visit (INDEPENDENT_AMBULATORY_CARE_PROVIDER_SITE_OTHER): Payer: BLUE CROSS/BLUE SHIELD

## 2018-06-20 DIAGNOSIS — M722 Plantar fascial fibromatosis: Secondary | ICD-10-CM | POA: Diagnosis not present

## 2018-06-20 NOTE — Telephone Encounter (Signed)
Orders to J. Quintana, RN for pre-cert and faxed to [facility]. 

## 2018-06-20 NOTE — Telephone Encounter (Signed)
-----   Message from Vivi BarrackMatthew R Wagoner, DPM sent at 06/20/2018  2:54 PM EDT ----- Can you please order an MRI of the ankle (?) to rule out plantar fascial tear. He has has PT, injections, orthotics, shoe changes and heel pain is getting worse

## 2018-06-24 NOTE — Progress Notes (Signed)
Subjective: 52 year old male presents the office today for follow-up evaluation of left heel pain.  He states that his pain that he is actually getting worse.  We will continue x-rays performed today as he really has changed.  Is been doing physical therapy without any improvement.  He is able to walk and wear regular shoe but he does have pain with walking at times and is walking with a limp.  Denies any systemic complaints such as fevers, chills, nausea, vomiting. No acute changes since last appointment, and no other complaints at this time.   Objective: AAO x3, NAD DP/PT pulses palpable bilaterally, CRT less than 3 seconds At this time there is still continued tenderness mostly to the plantar medial tubercle of the calcaneus at the insertion of plantar fascia the left foot.  There is no pain with lateral compression of the calcaneus.  There is no edema, erythema, increased warmth there is no pain to vibratory sensation of the calcaneus.  Achilles tendon appears to be intact.  Peroneal, flexor tendon is intact. No open lesions or pre-ulcerative lesions.  No pain with calf compression, swelling, warmth, erythema  Assessment: Chronic left heel pain, plantar fasciitis, rule out tear/stress fracture  Plan: -All treatment options discussed with the patient including all alternatives, risks, complications.  -Repeat x-rays were obtained today.  Heel spurs present.  On the lateral view there may be a radiolucent line consistent with a possible stress fracture in the body of the calcaneus but this can also be a shadow.  He has no pain with lateral compression of the calcaneus.  However given his continued pain and actually worsening-bolus in the cam boot this was dispensed today.  Also recommended MRI to rule out fracture or plantar fascial tear.  Continue to ice and elevate.  Hold physical therapy. -Patient encouraged to call the office with any questions, concerns, change in symptoms.   Vivi BarrackMatthew R  Brendan Gadson DPM

## 2018-07-01 ENCOUNTER — Ambulatory Visit
Admission: RE | Admit: 2018-07-01 | Discharge: 2018-07-01 | Disposition: A | Discharge status: Home / Self Care | Payer: BLUE CROSS/BLUE SHIELD | Source: Ambulatory Visit | Attending: Podiatry | Admitting: Podiatry

## 2018-07-01 DIAGNOSIS — M25572 Pain in left ankle and joints of left foot: Secondary | ICD-10-CM | POA: Diagnosis not present

## 2018-07-02 DIAGNOSIS — G4733 Obstructive sleep apnea (adult) (pediatric): Secondary | ICD-10-CM | POA: Diagnosis not present

## 2018-07-03 ENCOUNTER — Ambulatory Visit: Payer: BLUE CROSS/BLUE SHIELD | Admitting: Podiatry

## 2018-07-03 DIAGNOSIS — B351 Tinea unguium: Secondary | ICD-10-CM

## 2018-07-03 DIAGNOSIS — M722 Plantar fascial fibromatosis: Secondary | ICD-10-CM

## 2018-07-08 ENCOUNTER — Ambulatory Visit: Payer: Self-pay

## 2018-07-08 DIAGNOSIS — M722 Plantar fascial fibromatosis: Secondary | ICD-10-CM

## 2018-07-08 DIAGNOSIS — B351 Tinea unguium: Secondary | ICD-10-CM

## 2018-07-08 NOTE — Progress Notes (Signed)
Patient presents with chronic left heel pain, he states is been going on since early March.  He says he is tried injections, stretching, ice, and NSAIDs, and nothing has relieved his pain so far.  Noted pain on palpation to left heel mid to medial heel involvement.  ESWT administered 4 7 J and tolerated well.  Epat to entire plantar fascia.  Advised to avoid NSAIDs if possible and ice.  Discussed utilizing boot for 2 to 3 days posttreatment.  Is to follow-up next week for second treatment.

## 2018-07-10 ENCOUNTER — Other Ambulatory Visit: Payer: BLUE CROSS/BLUE SHIELD

## 2018-07-11 ENCOUNTER — Other Ambulatory Visit: Payer: BLUE CROSS/BLUE SHIELD

## 2018-07-13 DIAGNOSIS — G4733 Obstructive sleep apnea (adult) (pediatric): Secondary | ICD-10-CM | POA: Diagnosis not present

## 2018-07-15 ENCOUNTER — Ambulatory Visit: Payer: Self-pay

## 2018-07-15 DIAGNOSIS — B351 Tinea unguium: Secondary | ICD-10-CM

## 2018-07-15 DIAGNOSIS — M722 Plantar fascial fibromatosis: Secondary | ICD-10-CM

## 2018-07-15 NOTE — Progress Notes (Signed)
Patient presents with chronic left heel pain, he states is been going on since early March.  He says he is tried injections, stretching, ice, and NSAIDs, and nothing has relieved his pain so far.  Noted pain on palpation to left heel mid to medial heel involvement.  ESWT administered 9.4 J and tolerated well.  Epat to entire plantar fascia.  Advised to avoid NSAIDs if possible and ice.  Discussed utilizing boot for 2 to 3 days posttreatment.  Patient is to follow-up next week for third treatment.

## 2018-07-22 ENCOUNTER — Ambulatory Visit: Payer: Self-pay

## 2018-07-22 DIAGNOSIS — M722 Plantar fascial fibromatosis: Secondary | ICD-10-CM

## 2018-07-22 DIAGNOSIS — M79676 Pain in unspecified toe(s): Secondary | ICD-10-CM

## 2018-07-29 NOTE — Progress Notes (Signed)
Patient presents with chronic left heel pain, he states is been going on since early March.  He says he is tried injections, stretching, ice, and NSAIDs, and nothing has relieved his pain so far.  He states that he still has pain with activity, and when using regular shoes.  Noted pain on palpation to left heel mid to medial heel involvement.  ESWT administered 7 J and tolerated well.  Epat to entire plantar fascia.  Advised to avoid NSAIDs if possible and ice.  Discussed utilizing boot for 2 to 3 days posttreatment.  Patient is to follow-up in 2 to 4 weeks for fourth treatment.

## 2018-07-29 NOTE — Progress Notes (Addendum)
Patient presents with chronic left heel pain, he states is been going on since early March.  He says he is tried injections, stretching, ice, and NSAIDs, and nothing has relieved his pain so far.  He states that he still has pain with activity, and when using regular shoes.  He states that his pain is still present and has not improved much with treatment.  Noted pain on palpation to left heel mid to medial heel involvement.  ESWT administered 8 J and tolerated well.  Epat to entire plantar fascia.  I did advise him to discontinue stretching at this time.  He has been doing various modalities such as stretching wearing brace and using a massager.  I told him that he could continue lightly with the massaging, and wear his brace.  He is to follow-up with Dr. Ardelle Anton for reevaluation due to history of plantar fascial tear and no pain relief thus far.

## 2018-07-31 DIAGNOSIS — G4733 Obstructive sleep apnea (adult) (pediatric): Secondary | ICD-10-CM | POA: Diagnosis not present

## 2018-08-11 ENCOUNTER — Ambulatory Visit: Payer: BLUE CROSS/BLUE SHIELD | Admitting: Podiatry

## 2018-08-11 ENCOUNTER — Encounter: Payer: Self-pay | Admitting: Podiatry

## 2018-08-11 DIAGNOSIS — M722 Plantar fascial fibromatosis: Secondary | ICD-10-CM | POA: Diagnosis not present

## 2018-08-11 MED ORDER — TRIAMCINOLONE ACETONIDE 10 MG/ML IJ SUSP
10.0000 mg | Freq: Once | INTRAMUSCULAR | Status: AC
  Administered 2018-08-11: 10 mg

## 2018-08-11 NOTE — Patient Instructions (Signed)

## 2018-08-11 NOTE — Progress Notes (Signed)
Subjective: 52 year old male presents the office today for follow-up evaluation of left heel pain, plantar fasciitis.  He states that he is still having quite a bit of pain to the heel.  He did try some Oofos sandals which were helpful and his pain was much improved in those.  Also in tennis shoes he is better but still has some discomfort.  He tries to her work shoes as well when he gets a lot of pain.  He states is getting more pain in the outside aspect of his heel, plantar lateral aspect.  Denies any recent injury or falls or any significant swelling. Denies any systemic complaints such as fevers, chills, nausea, vomiting. No acute changes since last appointment, and no other complaints at this time.   Objective: AAO x3, NAD DP/PT pulses palpable bilaterally, CRT less than 3 seconds There is tenderness palpation on the plantar central aspect left heel as well as the plantar lateral band of plantar fascia.  There is no pain on the plantar medial tubercle at this time.  No pain on the arch of the foot.  Achilles tendon appears to be intact without any pain.  There is no edema, erythema.  Negative Tinel sign.  No open lesions or pre-ulcerative lesions.  No pain with calf compression, swelling, warmth, erythema  MRI 07/01/2018 IMPRESSION: 1. Thickening of the medial band of the plantar fascia adjacent to the calcaneal insertion with a partial-thickness tear consistent with plantar fasciitis.  Assessment: Left chronic heel pain, plantar fasciitis with heel spur  Plan: -All treatment options discussed with the patient including all alternatives, risks, complications.  -Overall he is doing better from the plantar medial aspect.  More the pain is in the central lateral band.  At this point we discussed surgical intervention he wished to hold off on this.  Today a steroid injection was performed on the lateral aspect.  See procedure note below.  Also noted to custom orthotics.  He was measured for  inserts today.  We discussed getting back to stretching, icing exercises daily.  Gradually start to return back to regular shoe. -Patient encouraged to call the office with any questions, concerns, change in symptoms.   Procedure: Injection Tendon/Ligament Discussed alternatives, risks, complications and verbal consent was obtained.  Location: Left plantar fascia at the glabrous junction; LATERAL approach. Skin Prep: Alcohol. Injectate: 0.5cc 0.5% marcaine plain, 0.5 cc 2% lidocaine plain and, 1 cc kenalog 10. Disposition: Patient tolerated procedure well. Injection site dressed with a band-aid.  Post-injection care was discussed and return precautions discussed.   Vivi BarrackMatthew R Jmya Uliano DPM

## 2018-08-18 DIAGNOSIS — G4733 Obstructive sleep apnea (adult) (pediatric): Secondary | ICD-10-CM | POA: Diagnosis not present

## 2018-08-19 DIAGNOSIS — M722 Plantar fascial fibromatosis: Secondary | ICD-10-CM | POA: Diagnosis not present

## 2018-08-22 DIAGNOSIS — M722 Plantar fascial fibromatosis: Secondary | ICD-10-CM | POA: Diagnosis not present

## 2018-09-01 ENCOUNTER — Encounter: Payer: BLUE CROSS/BLUE SHIELD | Admitting: Orthotics

## 2018-09-01 DIAGNOSIS — M722 Plantar fascial fibromatosis: Secondary | ICD-10-CM | POA: Diagnosis not present

## 2018-09-03 ENCOUNTER — Ambulatory Visit: Payer: BLUE CROSS/BLUE SHIELD | Admitting: Orthotics

## 2018-09-03 DIAGNOSIS — M722 Plantar fascial fibromatosis: Secondary | ICD-10-CM

## 2018-09-03 NOTE — Progress Notes (Signed)
Patient came in today to pick up custom made foot orthotics.  The goals were accomplished and the patient reported no dissatisfaction with said orthotics.  Patient was advised of breakin period and how to report any issues. 

## 2018-09-04 DIAGNOSIS — G4733 Obstructive sleep apnea (adult) (pediatric): Secondary | ICD-10-CM | POA: Diagnosis not present

## 2018-09-04 DIAGNOSIS — I712 Thoracic aortic aneurysm, without rupture: Secondary | ICD-10-CM | POA: Diagnosis not present

## 2018-09-04 DIAGNOSIS — E782 Mixed hyperlipidemia: Secondary | ICD-10-CM | POA: Diagnosis not present

## 2018-09-04 DIAGNOSIS — Z0000 Encounter for general adult medical examination without abnormal findings: Secondary | ICD-10-CM | POA: Diagnosis not present

## 2018-09-04 DIAGNOSIS — D696 Thrombocytopenia, unspecified: Secondary | ICD-10-CM | POA: Diagnosis not present

## 2018-09-05 DIAGNOSIS — E559 Vitamin D deficiency, unspecified: Secondary | ICD-10-CM | POA: Diagnosis not present

## 2018-09-05 DIAGNOSIS — I10 Essential (primary) hypertension: Secondary | ICD-10-CM | POA: Diagnosis not present

## 2018-09-05 DIAGNOSIS — E782 Mixed hyperlipidemia: Secondary | ICD-10-CM | POA: Diagnosis not present

## 2018-09-05 DIAGNOSIS — Z125 Encounter for screening for malignant neoplasm of prostate: Secondary | ICD-10-CM | POA: Diagnosis not present

## 2018-09-11 DIAGNOSIS — M722 Plantar fascial fibromatosis: Secondary | ICD-10-CM | POA: Diagnosis not present

## 2018-09-12 DIAGNOSIS — G4733 Obstructive sleep apnea (adult) (pediatric): Secondary | ICD-10-CM | POA: Diagnosis not present

## 2018-09-16 DIAGNOSIS — M722 Plantar fascial fibromatosis: Secondary | ICD-10-CM | POA: Diagnosis not present

## 2018-09-19 DIAGNOSIS — M722 Plantar fascial fibromatosis: Secondary | ICD-10-CM | POA: Diagnosis not present

## 2018-09-23 DIAGNOSIS — K219 Gastro-esophageal reflux disease without esophagitis: Secondary | ICD-10-CM | POA: Diagnosis not present

## 2018-09-23 DIAGNOSIS — D696 Thrombocytopenia, unspecified: Secondary | ICD-10-CM | POA: Diagnosis not present

## 2018-09-25 ENCOUNTER — Ambulatory Visit: Payer: BLUE CROSS/BLUE SHIELD | Admitting: Orthotics

## 2018-09-25 DIAGNOSIS — M722 Plantar fascial fibromatosis: Secondary | ICD-10-CM

## 2018-09-25 NOTE — Progress Notes (Signed)
Added more RF cushioning on lateral aspect of f/o.  Will bring back in two weeks to make permanent.

## 2018-09-26 DIAGNOSIS — M722 Plantar fascial fibromatosis: Secondary | ICD-10-CM | POA: Diagnosis not present

## 2018-10-09 DIAGNOSIS — G4733 Obstructive sleep apnea (adult) (pediatric): Secondary | ICD-10-CM | POA: Diagnosis not present

## 2018-10-10 DIAGNOSIS — M722 Plantar fascial fibromatosis: Secondary | ICD-10-CM | POA: Diagnosis not present

## 2018-10-13 DIAGNOSIS — G4733 Obstructive sleep apnea (adult) (pediatric): Secondary | ICD-10-CM | POA: Diagnosis not present

## 2018-10-20 ENCOUNTER — Encounter: Payer: Self-pay | Admitting: Podiatry

## 2018-10-20 ENCOUNTER — Ambulatory Visit: Payer: BLUE CROSS/BLUE SHIELD | Admitting: Podiatry

## 2018-10-20 DIAGNOSIS — M722 Plantar fascial fibromatosis: Secondary | ICD-10-CM

## 2018-10-21 DIAGNOSIS — M722 Plantar fascial fibromatosis: Secondary | ICD-10-CM | POA: Diagnosis not present

## 2018-10-22 NOTE — Progress Notes (Signed)
Subjective: 52 year old male presents the office today for follow-up evaluation of left heel pain, plantar fasciitis.  She states that her heel on the inside, medial aspect he points to is no longer causing any problems.  He states he only gets pain more to the also aspect of the heel.  He states that when he first gets up he puts on his Oofos and he has no pain.  He has been wearing the orthotics but he states that when he wears the orthotics and regular shoes as when he gets discomfort more to the lateral aspect of the heel that he points to.  Denies any swelling, numbness or tingling.  No recent injury or trauma.  No redness.  No other concerns. Denies any systemic complaints such as fevers, chills, nausea, vomiting. No acute changes since last appointment, and no other complaints at this time.   Objective: AAO x3, NAD DP/PT pulses palpable bilaterally, CRT less than 3 seconds There is no tenderness palpation on the plantar central/medial aspect left heel however there is subjective discomfort along the plantar lateral aspect of the heel.  There is no pain with lateral compression of the calcaneus.  Achilles tendon, plantar fascia appear to be intact.  Upon weightbearing valuation are significant flatfoot deformity present and calcaneal valgus is present.  Even when I have them put the orthotics and simulate weightbearing on the orthotics he does not have full correction.  Negative Tinel sign. No open lesions or pre-ulcerative lesions.  No pain with calf compression, swelling, warmth, erythema  MRI 07/01/2018 IMPRESSION: 1. Thickening of the medial band of the plantar fascia adjacent to the calcaneal insertion with a partial-thickness tear consistent with plantar fasciitis.  Assessment: Left chronic heel pain, plantar fasciitis with heel spur  Plan: -All treatment options discussed with the patient including all alternatives, risks, complications.  -Regards to plantar fasciitis she is doing  much better.  Still has some pain laterally.  Orthotics are not fitting appropriately.  I had Raiford NobleRick evaluate him today he was remeasured for orthotics.  Unfortunately he wants an orthotic that he is wearing dress eyedrops shoes is difficult to make this type to fit his foot.  [facility] NobleRick did rem[facility] him today [facility]  Injection did not help significantly thus the plan is to be held off on this today.  RTC 3 weeks to PUO or sooner if needed  Vivi BarrackMatthew R Wagoner DPM

## 2018-10-28 DIAGNOSIS — D696 Thrombocytopenia, unspecified: Secondary | ICD-10-CM | POA: Diagnosis not present

## 2018-11-10 ENCOUNTER — Ambulatory Visit: Payer: BLUE CROSS/BLUE SHIELD | Admitting: Orthotics

## 2018-11-10 DIAGNOSIS — M722 Plantar fascial fibromatosis: Secondary | ICD-10-CM

## 2018-11-10 NOTE — Progress Notes (Signed)
Patient came in today to pick up custom made foot orthotics.  The goals were accomplished and the patient reported no dissatisfaction with said orthotics.  Patient was advised of breakin period and how to report any issues. 

## 2018-11-12 DIAGNOSIS — G4733 Obstructive sleep apnea (adult) (pediatric): Secondary | ICD-10-CM | POA: Diagnosis not present

## 2019-01-02 DIAGNOSIS — D125 Benign neoplasm of sigmoid colon: Secondary | ICD-10-CM | POA: Diagnosis not present

## 2019-01-02 DIAGNOSIS — K5289 Other specified noninfective gastroenteritis and colitis: Secondary | ICD-10-CM | POA: Diagnosis not present

## 2019-01-02 DIAGNOSIS — K317 Polyp of stomach and duodenum: Secondary | ICD-10-CM | POA: Diagnosis not present

## 2019-01-02 DIAGNOSIS — K219 Gastro-esophageal reflux disease without esophagitis: Secondary | ICD-10-CM | POA: Diagnosis not present

## 2019-01-02 DIAGNOSIS — Z1211 Encounter for screening for malignant neoplasm of colon: Secondary | ICD-10-CM | POA: Diagnosis not present

## 2019-01-02 DIAGNOSIS — D123 Benign neoplasm of transverse colon: Secondary | ICD-10-CM | POA: Diagnosis not present

## 2019-01-02 DIAGNOSIS — K648 Other hemorrhoids: Secondary | ICD-10-CM | POA: Diagnosis not present

## 2019-01-02 DIAGNOSIS — K2930 Chronic superficial gastritis without bleeding: Secondary | ICD-10-CM | POA: Diagnosis not present

## 2019-01-02 DIAGNOSIS — K6389 Other specified diseases of intestine: Secondary | ICD-10-CM | POA: Diagnosis not present

## 2019-01-08 DIAGNOSIS — D125 Benign neoplasm of sigmoid colon: Secondary | ICD-10-CM | POA: Diagnosis not present

## 2019-01-08 DIAGNOSIS — K2930 Chronic superficial gastritis without bleeding: Secondary | ICD-10-CM | POA: Diagnosis not present

## 2019-01-08 DIAGNOSIS — K317 Polyp of stomach and duodenum: Secondary | ICD-10-CM | POA: Diagnosis not present

## 2019-01-08 DIAGNOSIS — D123 Benign neoplasm of transverse colon: Secondary | ICD-10-CM | POA: Diagnosis not present

## 2019-02-04 DIAGNOSIS — K219 Gastro-esophageal reflux disease without esophagitis: Secondary | ICD-10-CM | POA: Diagnosis not present

## 2019-02-04 DIAGNOSIS — K6289 Other specified diseases of anus and rectum: Secondary | ICD-10-CM | POA: Diagnosis not present

## 2019-02-16 DIAGNOSIS — G4733 Obstructive sleep apnea (adult) (pediatric): Secondary | ICD-10-CM | POA: Diagnosis not present

## 2019-02-24 ENCOUNTER — Other Ambulatory Visit: Payer: Self-pay | Admitting: Cardiothoracic Surgery

## 2019-02-24 DIAGNOSIS — I712 Thoracic aortic aneurysm, without rupture, unspecified: Secondary | ICD-10-CM

## 2019-05-06 ENCOUNTER — Ambulatory Visit
Admission: RE | Admit: 2019-05-06 | Discharge: 2019-05-06 | Disposition: A | Discharge status: Home / Self Care | Payer: BC Managed Care – PPO | Source: Ambulatory Visit | Attending: Cardiothoracic Surgery | Admitting: Cardiothoracic Surgery

## 2019-05-06 ENCOUNTER — Other Ambulatory Visit: Payer: Self-pay

## 2019-05-06 DIAGNOSIS — I712 Thoracic aortic aneurysm, without rupture, unspecified: Secondary | ICD-10-CM

## 2019-05-06 MED ORDER — IOPAMIDOL (ISOVUE-370) INJECTION 76%
75.0000 mL | Freq: Once | INTRAVENOUS | Status: AC | PRN
  Administered 2019-05-06: 10:00:00 75 mL via INTRAVENOUS

## 2019-05-07 ENCOUNTER — Encounter: Payer: Self-pay | Admitting: Cardiothoracic Surgery

## 2019-05-07 ENCOUNTER — Ambulatory Visit: Payer: BC Managed Care – PPO | Admitting: Cardiothoracic Surgery

## 2019-05-07 VITALS — BP 123/82 | HR 60 | Temp 97.7°F | Resp 16 | Ht 72.0 in | Wt 200.0 lb

## 2019-05-07 DIAGNOSIS — I712 Thoracic aortic aneurysm, without rupture, unspecified: Secondary | ICD-10-CM
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
## 2019-05-07 NOTE — Patient Instructions (Signed)

## 2019-07-16 DIAGNOSIS — G4733 Obstructive sleep apnea (adult) (pediatric): Secondary | ICD-10-CM | POA: Diagnosis not present

## 2019-08-12 DIAGNOSIS — G4733 Obstructive sleep apnea (adult) (pediatric): Secondary | ICD-10-CM | POA: Diagnosis not present

## 2019-08-27 DIAGNOSIS — Z23 Encounter for immunization: Secondary | ICD-10-CM | POA: Diagnosis not present

## 2019-09-28 DIAGNOSIS — Z125 Encounter for screening for malignant neoplasm of prostate: Secondary | ICD-10-CM | POA: Diagnosis not present

## 2019-09-28 DIAGNOSIS — Z79899 Other long term (current) drug therapy: Secondary | ICD-10-CM | POA: Diagnosis not present

## 2019-09-28 DIAGNOSIS — Z0000 Encounter for general adult medical examination without abnormal findings: Secondary | ICD-10-CM | POA: Diagnosis not present

## 2019-09-28 DIAGNOSIS — I712 Thoracic aortic aneurysm, without rupture: Secondary | ICD-10-CM | POA: Diagnosis not present

## 2019-09-28 DIAGNOSIS — I10 Essential (primary) hypertension: Secondary | ICD-10-CM | POA: Diagnosis not present

## 2019-09-28 DIAGNOSIS — E559 Vitamin D deficiency, unspecified: Secondary | ICD-10-CM | POA: Diagnosis not present

## 2019-09-28 DIAGNOSIS — E782 Mixed hyperlipidemia: Secondary | ICD-10-CM | POA: Diagnosis not present

## 2019-09-28 DIAGNOSIS — D696 Thrombocytopenia, unspecified: Secondary | ICD-10-CM | POA: Diagnosis not present

## 2019-09-29 ENCOUNTER — Other Ambulatory Visit: Payer: Self-pay | Admitting: Internal Medicine

## 2019-09-29 ENCOUNTER — Ambulatory Visit
Admission: RE | Admit: 2019-09-29 | Discharge: 2019-09-29 | Disposition: A | Discharge status: Home / Self Care | Payer: BC Managed Care – PPO | Source: Ambulatory Visit | Attending: Internal Medicine | Admitting: Internal Medicine

## 2019-09-29 DIAGNOSIS — M549 Dorsalgia, unspecified: Secondary | ICD-10-CM | POA: Diagnosis not present

## 2019-09-29 DIAGNOSIS — M5489 Other dorsalgia: Secondary | ICD-10-CM

## 2019-10-27 DIAGNOSIS — Z86010 Personal history of colonic polyps: Secondary | ICD-10-CM | POA: Diagnosis not present

## 2019-10-27 DIAGNOSIS — K6289 Other specified diseases of anus and rectum: Secondary | ICD-10-CM | POA: Diagnosis not present

## 2019-10-27 DIAGNOSIS — K219 Gastro-esophageal reflux disease without esophagitis: Secondary | ICD-10-CM | POA: Diagnosis not present

## 2019-11-06 DIAGNOSIS — G4733 Obstructive sleep apnea (adult) (pediatric): Secondary | ICD-10-CM | POA: Diagnosis not present

## 2020-03-15 ENCOUNTER — Ambulatory Visit
Admission: RE | Admit: 2020-03-15 | Discharge: 2020-03-15 | Disposition: A | Discharge status: Home / Self Care | Payer: BC Managed Care – PPO | Source: Ambulatory Visit | Attending: Internal Medicine | Admitting: Internal Medicine

## 2020-03-15 ENCOUNTER — Other Ambulatory Visit: Payer: Self-pay | Admitting: Internal Medicine

## 2020-03-15 DIAGNOSIS — M5412 Radiculopathy, cervical region: Secondary | ICD-10-CM

## 2020-03-16 ENCOUNTER — Other Ambulatory Visit: Payer: Self-pay | Admitting: Internal Medicine

## 2020-03-16 DIAGNOSIS — M545 Low back pain, unspecified: Secondary | ICD-10-CM

## 2020-03-18 ENCOUNTER — Ambulatory Visit
Admission: RE | Admit: 2020-03-18 | Discharge: 2020-03-18 | Disposition: A | Discharge status: Home / Self Care | Payer: 59 | Source: Ambulatory Visit | Attending: Internal Medicine | Admitting: Internal Medicine

## 2020-03-18 ENCOUNTER — Other Ambulatory Visit: Payer: 59

## 2020-03-18 DIAGNOSIS — M545 Low back pain, unspecified: Secondary | ICD-10-CM

## 2020-03-25 ENCOUNTER — Other Ambulatory Visit: Payer: Self-pay | Admitting: Internal Medicine

## 2020-03-25 IMAGING — CT CT ANGIOGRAPHY CHEST
2 of 6 series · 13 of 36 positions shown · IV contrast (iopamidol)
Comparison: Chest CT 04/03/2018

CLINICAL DATA: Follow-up thoracic aortic aneurysm.

EXAM:
CT ANGIOGRAPHY CHEST WITH CONTRAST
TECHNIQUE: Multidetector CT imaging of the chest was performed using the
standard protocol during bolus administration of intravenous
contrast. Multiplanar CT image reconstructions and MIPs were
obtained to evaluate the vascular anatomy.
CONTRAST:  75mL SKZPDW-C0R IOPAMIDOL (SKZPDW-C0R) INJECTION 76%

[Series 4: cta thorax 2.00 bv36 s3 ax axial arterial · axial · arterial · 0.63mm/px · z∈[+1569,+1849]mm · 12 of 166 slices shown]
[im 13/166  lung]
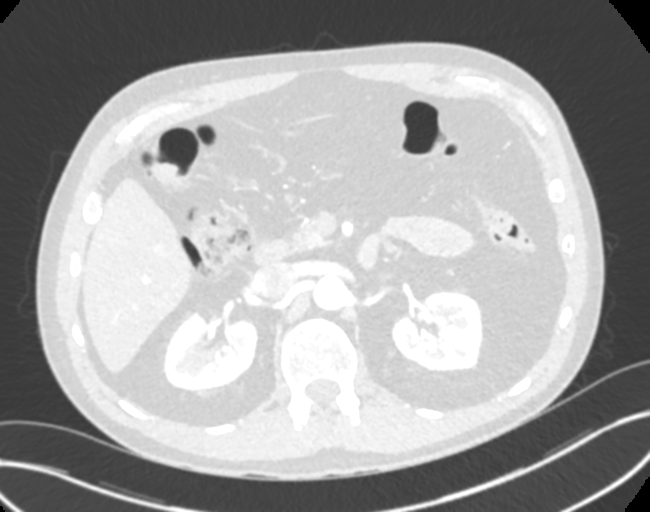
[im 26/166  mediastinal]
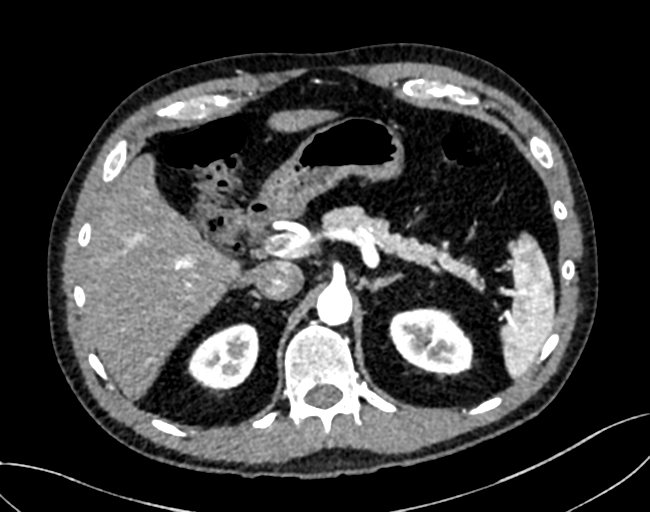
[im 39/166  lung]
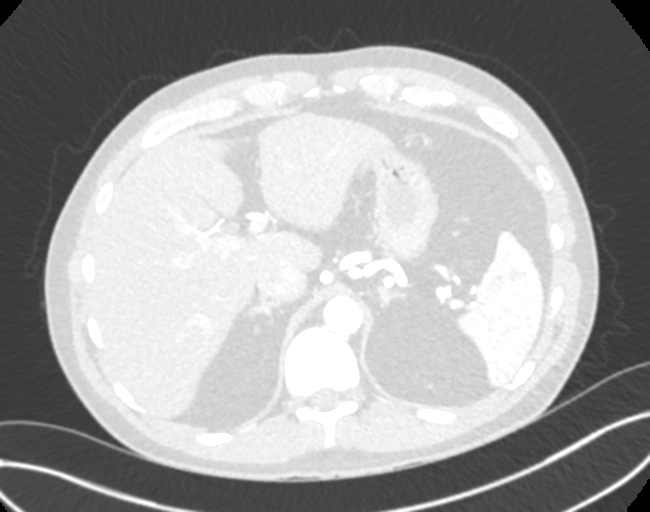
[im 51/166  mediastinal]
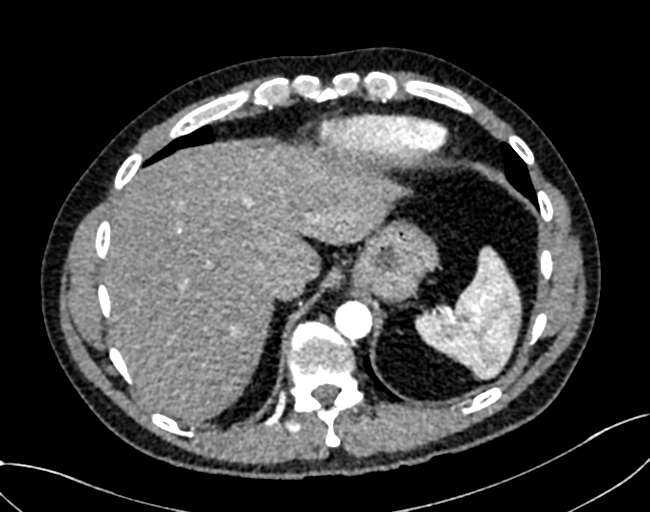
[im 64/166  lung]
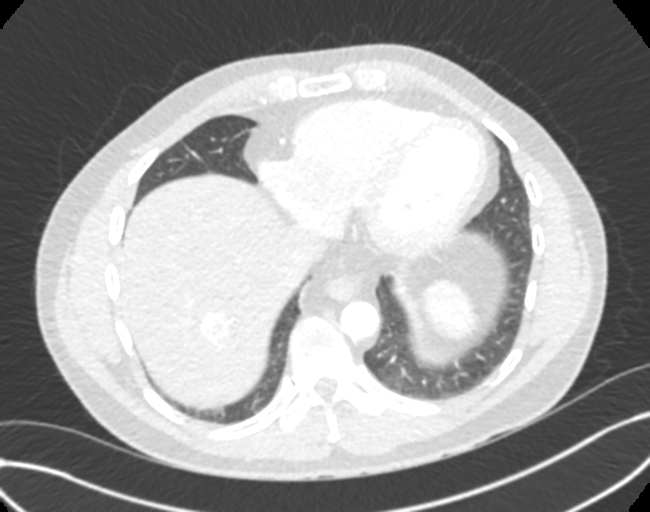
[im 77/166  mediastinal]
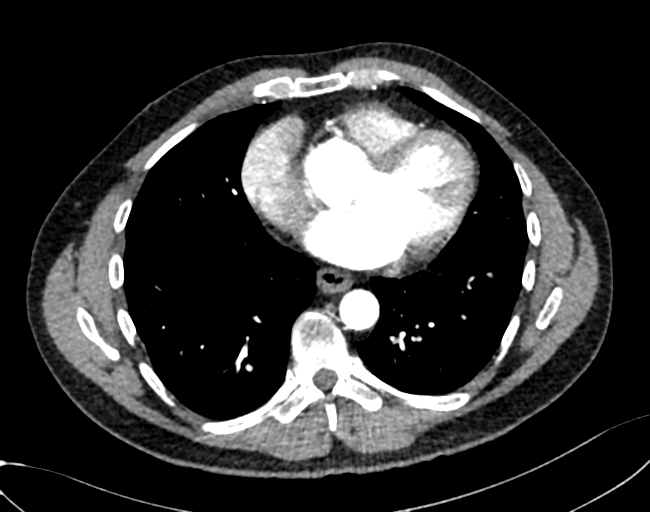
[im 89/166  lung]
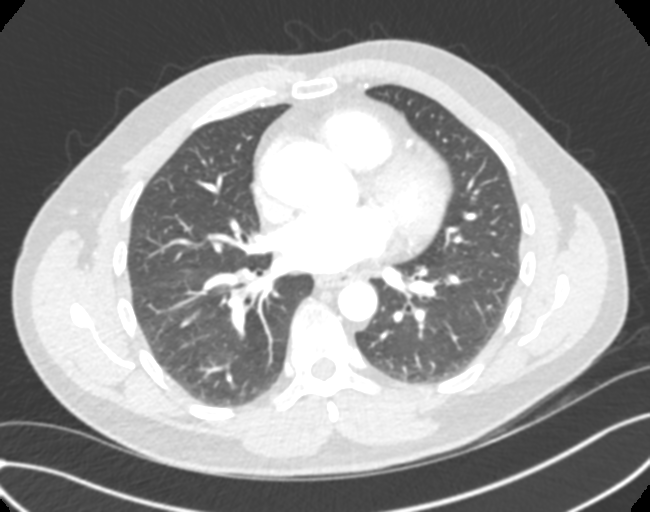
[im 102/166  mediastinal]
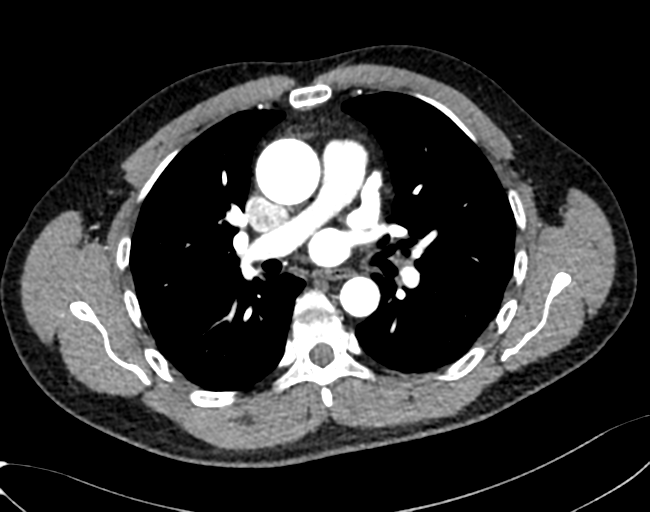
[im 115/166  lung]
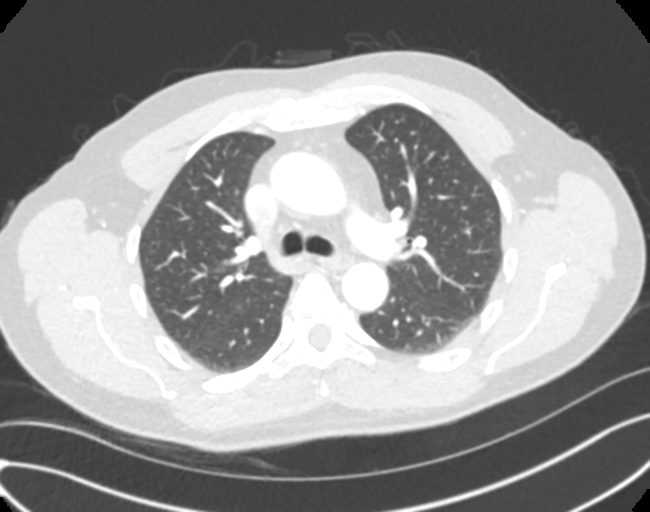
[im 127/166  mediastinal]
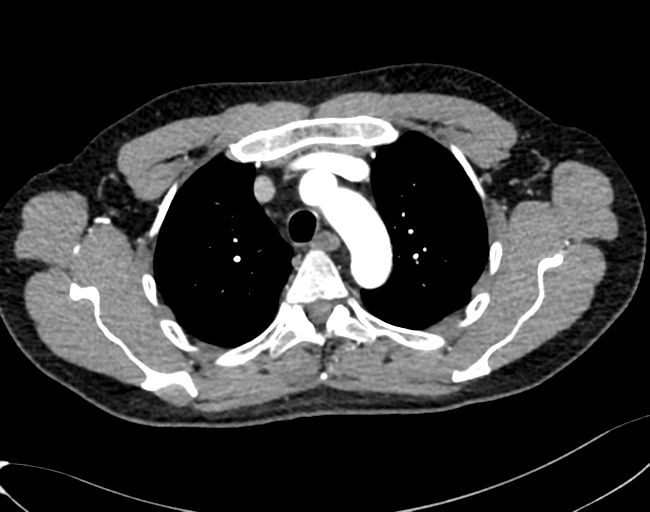
[im 140/166  lung]
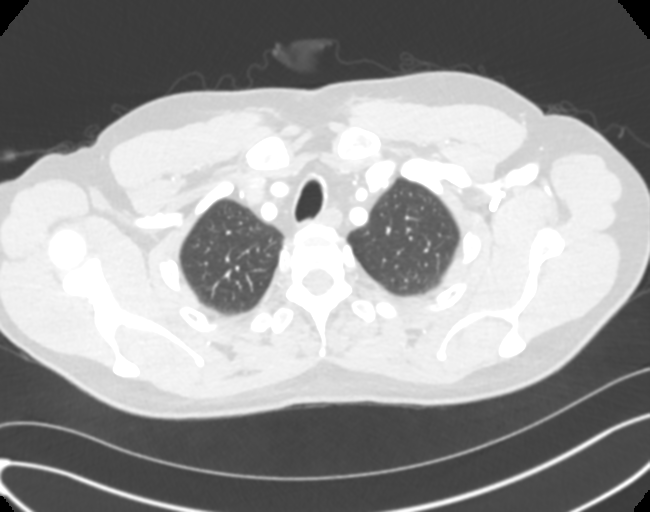
[im 153/166  mediastinal]
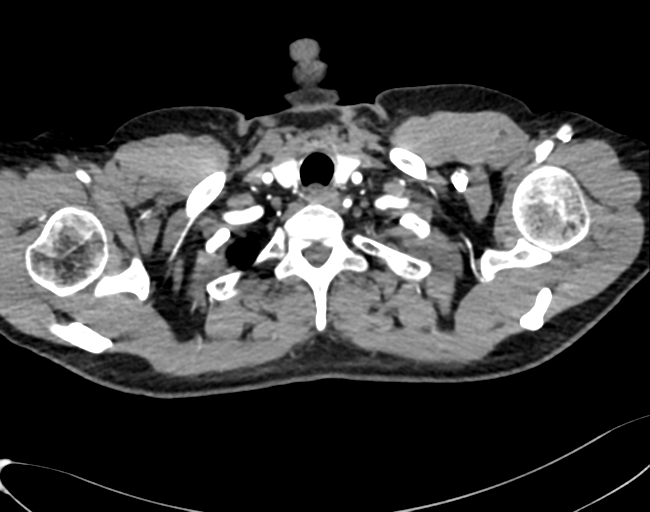

[Series 9: cta thorax 2.00 bv36 s3 cor cor st · coronal · 0.65mm/px · 1 of 159 slices shown]
[im 80/159  mediastinal]
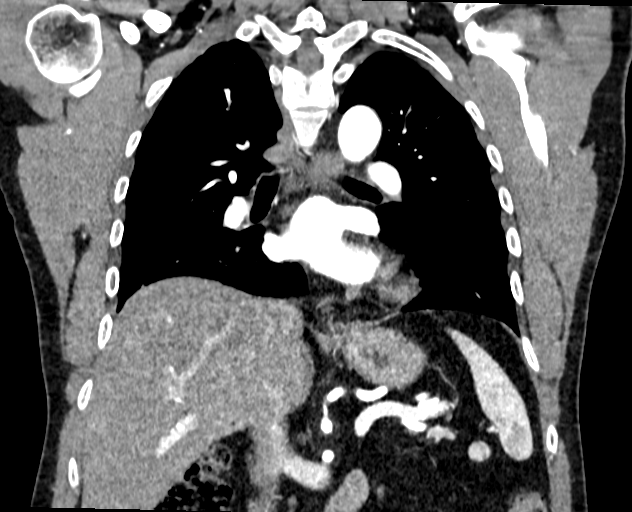

[13 of 36 positions shown; findings below may reference images not displayed]

FINDINGS: Cardiovascular: The heart is normal in size. No pericardial
effusion. Stable mild fusiform aneurysmal dilatation of the
ascending aorta with maximum measurement of 4.0 cm measured at the
level of the right main pulmonary artery. No dissection. No
atherosclerotic calcifications. No definite coronary artery
calcifications.

The pulmonary arteries appear normal.

Mediastinum/Nodes: No mediastinal or hilar mass or lymphadenopathy.
The esophagus is grossly normal.

Lungs/Pleura: No acute pulmonary findings. No worrisome pulmonary
lesions or pleural effusion.

Upper Abdomen: Stable 2 cm enhancing lesion at the hepatic dome,
unchanged since 1811 and most consistent with a benign hemangioma.
The gallbladder is surgically absent.

Musculoskeletal: No chest wall mass, supraclavicular or axillary
adenopathy. The thyroid gland appears normal.

No acute bony findings. Evidence of a remote healed fracture of the
manubrium of the sternum.

Review of the MIP images confirms the above findings.
IMPRESSION: 1. Stable mild fusiform aneurysmal dilatation of the ascending aorta
measuring a maximum of 4 cm. No dissection or atherosclerotic
calcifications.
2. Normal appearing coronary arteries without atherosclerotic
calcification.
3. No mediastinal or hilar mass or adenopathy.
4. No acute pulmonary findings.
5. Stable hepatic dome lesion most consistent with benign
hemangioma.

## 2020-03-28 ENCOUNTER — Other Ambulatory Visit: Payer: Self-pay | Admitting: Internal Medicine

## 2020-03-28 DIAGNOSIS — M5412 Radiculopathy, cervical region: Secondary | ICD-10-CM

## 2020-04-14 ENCOUNTER — Ambulatory Visit
Admission: RE | Admit: 2020-04-14 | Discharge: 2020-04-14 | Disposition: A | Discharge status: Home / Self Care | Payer: 59 | Source: Ambulatory Visit | Attending: Internal Medicine | Admitting: Internal Medicine

## 2020-04-14 ENCOUNTER — Other Ambulatory Visit: Payer: Self-pay

## 2020-04-14 DIAGNOSIS — M5412 Radiculopathy, cervical region: Secondary | ICD-10-CM

## 2020-10-07 ENCOUNTER — Other Ambulatory Visit: Payer: Self-pay | Admitting: *Deleted

## 2020-10-07 DIAGNOSIS — I712 Thoracic aortic aneurysm, without rupture, unspecified: Secondary | ICD-10-CM

## 2020-11-10 ENCOUNTER — Ambulatory Visit: Payer: No Typology Code available for payment source | Admitting: Cardiothoracic Surgery

## 2020-11-10 ENCOUNTER — Other Ambulatory Visit: Payer: Self-pay

## 2020-11-10 ENCOUNTER — Ambulatory Visit
Admission: RE | Admit: 2020-11-10 | Discharge: 2020-11-10 | Disposition: A | Discharge status: Home / Self Care | Payer: 59 | Source: Ambulatory Visit | Attending: Cardiothoracic Surgery | Admitting: Cardiothoracic Surgery

## 2020-11-10 VITALS — BP 117/72 | HR 69 | Temp 97.6°F | Resp 20 | Ht 72.0 in | Wt 201.0 lb

## 2020-11-10 DIAGNOSIS — I712 Thoracic aortic aneurysm, without rupture, unspecified: Secondary | ICD-10-CM

## 2020-11-10 MED ORDER — IOPAMIDOL (ISOVUE-370) INJECTION 76%
75.0000 mL | Freq: Once | INTRAVENOUS | Status: AC | PRN
  Administered 2020-11-10: 75 mL via INTRAVENOUS

## 2020-11-10 NOTE — Patient Instructions (Signed)
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
## 2021-07-18 ENCOUNTER — Ambulatory Visit: Payer: No Typology Code available for payment source | Admitting: Podiatry

## 2021-07-18 ENCOUNTER — Ambulatory Visit (INDEPENDENT_AMBULATORY_CARE_PROVIDER_SITE_OTHER): Payer: No Typology Code available for payment source

## 2021-07-18 ENCOUNTER — Other Ambulatory Visit: Payer: Self-pay

## 2021-07-18 DIAGNOSIS — M25571 Pain in right ankle and joints of right foot: Secondary | ICD-10-CM

## 2021-07-18 DIAGNOSIS — M7751 Other enthesopathy of right foot: Secondary | ICD-10-CM

## 2021-07-18 DIAGNOSIS — M1000 Idiopathic gout, unspecified site: Secondary | ICD-10-CM | POA: Diagnosis not present

## 2021-07-18 DIAGNOSIS — M722 Plantar fascial fibromatosis: Secondary | ICD-10-CM

## 2021-07-18 DIAGNOSIS — M25471 Effusion, right ankle: Secondary | ICD-10-CM | POA: Diagnosis not present

## 2021-07-18 LAB — CBC WITH DIFFERENTIAL/PLATELET
Absolute Monocytes: 556 cells/uL (ref 200–950)
Basophils Absolute: 67 cells/uL (ref 0–200)
Basophils Relative: 1 %
Eosinophils Absolute: 188 cells/uL (ref 15–500)
Eosinophils Relative: 2.8 %
HCT: 45.9 % (ref 38.5–50.0)
Hemoglobin: 15.2 g/dL (ref 13.2–17.1)
Lymphs Abs: 1916 cells/uL (ref 850–3900)
MCH: 30.2 pg (ref 27.0–33.0)
MCHC: 33.1 g/dL (ref 32.0–36.0)
MCV: 91.1 fL (ref 80.0–100.0)
MPV: 12.3 fL (ref 7.5–12.5)
Monocytes Relative: 8.3 %
Neutro Abs: 3973 cells/uL (ref 1500–7800)
Neutrophils Relative %: 59.3 %
Platelets: 168 10*3/uL (ref 140–400)
RBC: 5.04 10*6/uL (ref 4.20–5.80)
RDW: 12.8 % (ref 11.0–15.0)
Total Lymphocyte: 28.6 %
WBC: 6.7 10*3/uL (ref 3.8–10.8)

## 2021-07-18 LAB — BASIC METABOLIC PANEL
BUN: 14 mg/dL (ref 7–25)
CO2: 30 mmol/L (ref 20–32)
Calcium: 9.6 mg/dL (ref 8.6–10.3)
Chloride: 104 mmol/L (ref 98–110)
Creat: 1.19 mg/dL (ref 0.70–1.30)
Glucose, Bld: 82 mg/dL (ref 65–99)
Potassium: 4.4 mmol/L (ref 3.5–5.3)
Sodium: 140 mmol/L (ref 135–146)

## 2021-07-18 LAB — URIC ACID: Uric Acid, Serum: 6.3 mg/dL (ref 4.0–8.0)

## 2021-07-18 MED ORDER — METHYLPREDNISOLONE 4 MG PO TBPK: ORAL_TABLET | ORAL | 0 refills | Status: DC

## 2021-07-24 NOTE — Progress Notes (Signed)
Subjective:   Patient ID: Jonathan Joseph, male   DOB: 55 y.o.   MRN: [id number]   HPI 55 year old male presents the office today for concerns of his right foot, ankle hurting.  He states that this started about 3 days ago and his pain is 5/10.  He states he is wearing more dress shoes with the discomfort started.  No specific injury he reports.  No recent injury.  No treatment.  No other concerns.   Review of Systems  All other systems reviewed and are negative.  No past medical history on file.  Past Surgical History:  Procedure Laterality Date   CHOLECYSTECTOMY     EYE SURGERY     KNEE SURGERY     RENAL BIOPSY, PERCUTANEOUS       Current Outpatient Medications:    methylPREDNISolone (MEDROL DOSEPAK) 4 MG TBPK tablet, Take as directed, Disp: 21 tablet, Rfl: 0   amLODipine-valsartan (EXFORGE) 5-160 MG tablet, Take 1 tablet by mouth daily., Disp: , Rfl: 2   aspirin EC 81 MG tablet, Take 81 mg by mouth daily., Disp: , Rfl:    esomeprazole (NEXIUM) 20 MG capsule, Take 20 mg by mouth. EVERY OTHER DAY, Disp: , Rfl:    levocetirizine (XYZAL) 5 MG tablet, Take 5 mg by mouth every evening. IN THE SPRING, Disp: , Rfl:    rosuvastatin (CRESTOR) 10 MG tablet, Take 10 mg by mouth. EVERY OTHER DAY, Disp: , Rfl:   Current Facility-Administered Medications:    triamcinolone acetonide (KENALOG) 10 MG/ML injection 10 mg, 10 mg, Other, Once, Vivi Barrack, DPM  Allergies  Allergen Reactions   Penicillins Hives   Quinolones     Patient was warned about not using Cipro and similar antibiotics. Recent studies have raised concern that fluoroquinolone antibiotics could be associated with an increased risk of aortic aneurysm Fluoroquinolones have non-antimicrobial properties that might jeopardise the integrity of the extracellular matrix of the vascular wall In a  propensity score matched cohort study in [place], there was a 66% increased rate of aortic aneurysm or dissection associated with  oral fluoroquinolone use, compared wit   Tetracyclines & Related Hives          Objective:  Physical Exam  General: AAO x3, NAD  Dermatological: There are no open sores, no preulcerative lesions, no rash or signs of infection present.  Vascular: Dorsalis Pedis artery and Posterior Tibial artery pedal pulses are 2/4 bilateral with immedate capillary fill time.  There is no pain with calf compression, swelling, warmth, erythema.   Neruologic: Grossly intact via light touch bilateral.   Musculoskeletal: There is a bony prominence present along the lateral malleolus.  States is always been at this but seems to be somewhat larger.  There is localized edema but there is no erythema or warmth.  Flexor, extensor tendons appear to be intact.  No pain with ankle treatment or motion.  No swelling or redness to the medial aspect ankle.  Muscular strength 5/5 in all groups tested bilateral.  Pain on the left is resolved.  Gait: Unassisted, Nonantalgic.       Assessment:   Right ankle capsulitis     Plan:  -Treatment options discussed including all alternatives, risks, and complications -Etiology of symptoms were discussed -X-rays were obtained and reviewed with the patient.  No evidence of acute fracture or stress fracture identified today.  Of note there is scarring versus possible cyst at the distal tibia likely benign in origin. -I ordered blood work today  including Uric acid, CBC, BMP. -Medrol Dosepak prescribed. -If symptoms persist MRI  Vivi Barrack DPM

## 2021-07-26 ENCOUNTER — Other Ambulatory Visit: Payer: Self-pay | Admitting: Podiatry

## 2021-07-26 DIAGNOSIS — M7751 Other enthesopathy of right foot: Secondary | ICD-10-CM

## 2021-08-17 ENCOUNTER — Other Ambulatory Visit: Payer: Self-pay

## 2021-08-17 ENCOUNTER — Ambulatory Visit: Payer: No Typology Code available for payment source | Admitting: Podiatry

## 2021-08-17 DIAGNOSIS — M7751 Other enthesopathy of right foot: Secondary | ICD-10-CM | POA: Diagnosis not present

## 2021-08-17 DIAGNOSIS — M25471 Effusion, right ankle: Secondary | ICD-10-CM | POA: Diagnosis not present

## 2021-08-23 NOTE — Progress Notes (Signed)
Subjective: 55 year old male presents the office today for follow-up evaluation of right foot pain.  He states that overall he is doing much better and immediately canceled the appointment however he gets some occasional flareups.  No recent injury or trauma or any changes since I last saw him and he has no other concerns.  Objective: AAO x3, NAD-presents today wearing regular shoes. DP/PT pulses palpable bilaterally, CRT less than 3 seconds On today's exam there is no specific area pinpoint tenderness.  Minimal edema to the lateral aspect of ankle.  No specific area of tenderness.-Status post left, fifth metatarsal base or other areas of the foot.  MMT 5/5. No pain with calf compression, swelling, warmth, erythema  Assessment:  Capsulitis right foot  Plan: -All treatment options discussed with the patient including all alternatives, risks, complications.  -Overall doing better.  Encouraged him to continue with shoes and good arch support.  He can use anti-inflammatories as needed. -Patient encouraged to call the office with any questions, concerns, change in symptoms.   Vivi Barrack DPM

## 2021-12-13 ENCOUNTER — Encounter: Payer: Self-pay | Admitting: Podiatrist

## 2021-12-13 ENCOUNTER — Ambulatory Visit: Payer: No Typology Code available for payment source | Admitting: Podiatrist

## 2021-12-13 ENCOUNTER — Other Ambulatory Visit: Payer: Self-pay

## 2021-12-13 DIAGNOSIS — M2140 Flat foot [pes planus] (acquired), unspecified foot: Secondary | ICD-10-CM | POA: Diagnosis not present

## 2021-12-13 DIAGNOSIS — M722 Plantar fascial fibromatosis: Secondary | ICD-10-CM | POA: Diagnosis not present

## 2021-12-13 NOTE — Patient Instructions (Signed)

## 2021-12-13 NOTE — Progress Notes (Signed)
Chief Complaint  Patient presents with   Plantar Fasciitis    Right foot PF. Pt states in the past month the pain in his heel has increased Sharp pain.      HPI: Patient is 56 y.o. male who presents today for pain in the right heel.  Patient relates he has been increasing his activity utilizing a stair climber at home and a elliptical trainer and has noticed pain in the right heel which is not relieved with shoe changes, anti-inflammatories, over-the-counter inserts or stretching.  He is getting ready to go on a ski trip in February and would like to try and get the heel pain under control.   Allergies  Allergen Reactions   Penicillins Hives   Quinolones     Patient was warned about not using Cipro and similar antibiotics. Recent studies have raised concern that fluoroquinolone antibiotics could be associated with an increased risk of aortic aneurysm Fluoroquinolones have non-antimicrobial properties that might jeopardise the integrity of the extracellular matrix of the vascular wall In a  propensity score matched cohort study in [place], there was a 66% increased rate of aortic aneurysm or dissection associated with oral fluoroquinolone use, compared wit   Tetracyclines & Related Hives    Review of systems is negative except as noted in the HPI.  Denies nausea/ vomiting/ fevers/ chills or night sweats.   Denies difficulty breathing, denies calf pain or tenderness  Physical Exam  Patient is awake, alert, and oriented x 3.  In no acute distress.    Vascular status is intact with palpable pedal pulses DP and PT bilateral and capillary refill time less than 3 seconds bilateral.  No edema or erythema noted.   Neurological exam reveals epicritic and protective sensation grossly intact bilateral.   Dermatological exam reveals skin is supple and dry to bilateral feet.  No open lesions present.    Musculoskeletal exam: Musculature intact with dorsiflexion, plantarflexion, inversion, eversion.  Ankle and First MPJ joint range of motion normal.  Pain on palpation plantar medial aspect of the right heel at the insertion of the plantar fashion on the medial heel tubercle is noted.  No pain with medial to lateral squeeze test no pain with direct plantar pressure on the calcaneus.  No pain along the posterior tibial tendon noted.  Elongated arch structure with pes planus foot type noted.  Assessment:   ICD-10-CM   1. Plantar fascial fibromatosis  M72.2     2. Pes planus, unspecified laterality  M21.40        Plan: Exam findings and treatment options were discussed with the patient.  This does appear to be plantar fasciitis with inflammation causing pain.  He is tried conservative treatments and therefore recommended an injection into the area of pain.  The patient agreed to prep the skin with alcohol and infiltrated 10 mg of Kenalog with Marcaine plain in the area of maximal tenderness right heel.  The patient tolerated this well.  I recommended he go back into his walking boot for the next week and to decrease his exercise for the next week as well.  He may continue light stretching.  Also recommended continued use of his Aleve anti-inflammatory and recommended custom orthotic devices (he had a pair 3 years ago but misplaced them when the foot pain resolved).  The patient will consider the orthotics and will be seen back in 3 weeks for follow-up with Dr. Ardelle Anton.

## 2022-01-04 ENCOUNTER — Ambulatory Visit: Payer: No Typology Code available for payment source | Admitting: Podiatry

## 2022-01-04 ENCOUNTER — Other Ambulatory Visit: Payer: Self-pay

## 2022-01-04 DIAGNOSIS — M722 Plantar fascial fibromatosis: Secondary | ICD-10-CM

## 2022-01-08 MED ORDER — MELOXICAM 15 MG PO TABS: 15.0000 mg | ORAL_TABLET | Freq: Every day | ORAL | 0 refills | Status: DC | PRN

## 2022-01-08 NOTE — Progress Notes (Signed)
Subjective: 56 year old male presents the office today for follow-up evaluation of right heel pain, plantar fasciitis.  He was last seen by Dr. Irving Shows on 12/13/2021 and at that time he had a steroid injection performed.  He states that is doing better but still having discomfort.  Feel that he is walking on a needle at times.  Pain level is 4/10 and has improved.  Still using the orthotics as well as stretching.  No recent injuries or changes otherwise.  Objective: AAO x3, NAD DP/PT pulses palpable bilaterally, CRT less than 3 seconds Tenderness palpation along the plantar medial tubercle of calcaneus at the insertion of plantar fascia on the right side.  There is no pain with compression of calcaneus.  No Tinel sign.  No pain in the Achilles tendon.  Flexor, extensor tendons are intact.  MMT 5/5. No pain with calf compression, swelling, warmth, erythema  Assessment: Right heel pain, Planter fasciitis  Plan: -All treatment options discussed with the patient including all alternatives, risks, complications.  -Overall has been improving.  However at this point will refer to physical therapy.  Prescription was written for [facility] and elected to also include dry needling.  Continue with supportive shoe gears and home stretching for now.  Refill meloxicam to use as needed. -Patient encouraged to call the office with any questions, concerns, change in symptoms.   Vivi Barrack DPM

## 2022-01-09 ENCOUNTER — Other Ambulatory Visit: Payer: Self-pay

## 2022-01-09 ENCOUNTER — Telehealth: Payer: Self-pay | Admitting: *Deleted

## 2022-01-09 DIAGNOSIS — M722 Plantar fascial fibromatosis: Secondary | ICD-10-CM

## 2022-01-09 NOTE — Telephone Encounter (Signed)
[facility] calling for PT order for patient. Faxed the PT order to [facility], received confirmation 01/09/22.

## 2022-02-05 ENCOUNTER — Other Ambulatory Visit: Payer: Self-pay | Admitting: Podiatry

## 2022-03-01 ENCOUNTER — Ambulatory Visit: Payer: No Typology Code available for payment source | Admitting: Podiatry

## 2022-03-01 ENCOUNTER — Other Ambulatory Visit: Payer: Self-pay | Admitting: Podiatry

## 2022-03-01 DIAGNOSIS — M722 Plantar fascial fibromatosis: Secondary | ICD-10-CM

## 2022-03-01 NOTE — Patient Instructions (Signed)
While at your visit today you received a steroid injection in your foot or ankle to help with your pain. Along with having the steroid medication there is some "numbing" medication in the shot that you received. Due to this you may notice some numbness to the area for the next couple of hours.  ? ?I would recommend limiting activity for the next few days to help the steroid injection take affect.  ?  ?The actually benefit from the steroid injection may take up to 2-7 days to see a difference. You may actually experience a small (as in 10%) INCREASE in pain in the first 24 hours---that is common. It would be best if you can ice the area today and take anti-inflammatory medications (such as Ibuprofen, Motrin, or Aleve) if you are able to take these medications. If you were prescribed another medication to help with the pain go ahead and start that medication today  ?  ?Things to watch out for that you should contact us or a health care provider urgently would include: ?1. Unusual (as in more than 10%) increase in pain ?2. New fever > 101.5 ?3. New swelling or redness of the injected area.  ?4. Streaking of red lines around the area injected. ? ?If you have any questions or concerns about this, please give our office a call at [phone].  ? ? ?Plantar Fasciitis (Heel Spur Syndrome) ?with Rehab ?The plantar fascia is a fibrous, ligament-like, soft-tissue structure that spans the bottom of the foot. Plantar fasciitis is a condition that causes pain in the foot due to inflammation of the tissue. ?SYMPTOMS  ?Pain and tenderness on the underneath side of the foot. ?Pain that worsens with standing or walking. ?CAUSES  ?Plantar fasciitis is caused by irritation and injury to the plantar fascia on the underneath side of the foot. Common mechanisms of injury include: ?Direct trauma to bottom of the foot. ?Damage to a small nerve that runs under the foot where the main fascia attaches to the heel bone. ?Stress placed on the  plantar fascia due to bone spurs. ?RISK INCREASES WITH:  ?Activities that place stress on the plantar fascia (running, jumping, pivoting, or cutting). ?Poor strength and flexibility. ?Improperly fitted shoes. ?Tight calf muscles. ?Flat feet. ?Failure to warm-up properly before activity. ?Obesity. ?PREVENTION ?Warm up and stretch properly before activity. ?Allow for adequate recovery between workouts. ?Maintain physical fitness: ?Strength, flexibility, and endurance. ?Cardiovascular fitness. ?Maintain a health body weight. ?Avoid stress on the plantar fascia. ?Wear properly fitted shoes, including arch supports for individuals who have flat feet. ? ?PROGNOSIS  ?If treated properly, then the symptoms of plantar fasciitis usually resolve without surgery. However, occasionally surgery is necessary. ? ?RELATED COMPLICATIONS  ?Recurrent symptoms that may result in a chronic condition. ?Problems of the lower back that are caused by compensating for the injury, such as limping. ?Pain or weakness of the foot during push-off following surgery. ?Chronic inflammation, scarring, and partial or complete fascia tear, occurring more often from repeated injections. ? ?TREATMENT  ?Treatment initially involves the use of ice and medication to help reduce pain and inflammation. The use of strengthening and stretching exercises may help reduce pain with activity, especially stretches of the Achilles tendon. These exercises may be performed at home or with a therapist. Your caregiver may recommend that you use heel cups of arch supports to help reduce stress on the plantar fascia. Occasionally, corticosteroid injections are given to reduce inflammation. If symptoms persist for greater than 6 months despite   non-surgical (conservative), then surgery may be recommended.  ? ?MEDICATION  ?If pain medication is necessary, then nonsteroidal anti-inflammatory medications, such as aspirin and ibuprofen, or other minor pain relievers, such as  acetaminophen, are often recommended. ?Do not take pain medication within 7 days before surgery. ?Prescription pain relievers may be given if deemed necessary by your caregiver. Use only as directed and only as much as you need. ?Corticosteroid injections may be given by your caregiver. These injections should be reserved for the most serious cases, because they may only be given a certain number of times. ? ?HEAT AND COLD ?Cold treatment (icing) relieves pain and reduces inflammation. Cold treatment should be applied for 10 to 15 minutes every 2 to 3 hours for inflammation and pain and immediately after any activity that aggravates your symptoms. Use ice packs or massage the area with a piece of ice (ice massage). ?Heat treatment may be used prior to performing the stretching and strengthening activities prescribed by your caregiver, physical therapist, or athletic trainer. Use a heat pack or soak the injury in warm water. ? ?SEEK IMMEDIATE MEDICAL CARE IF: ?Treatment seems to offer no benefit, or the condition worsens. ?Any medications produce adverse side effects. ? ?EXERCISES- ?RANGE OF MOTION (ROM) AND STRETCHING EXERCISES - Plantar Fasciitis (Heel Spur Syndrome) ?These exercises may help you when beginning to rehabilitate your injury. Your symptoms may resolve with or without further involvement from your physician, physical therapist or athletic trainer. While completing these exercises, remember:  ?Restoring tissue flexibility helps normal motion to return to the joints. This allows healthier, less painful movement and activity. ?An effective stretch should be held for at least 30 seconds. ?A stretch should never be painful. You should only feel a gentle lengthening or release in the stretched tissue. ? ?RANGE OF MOTION - Toe Extension, Flexion ?Sit with your right / left leg crossed over your opposite knee. ?Grasp your toes and gently pull them back toward the top of your foot. You should feel a stretch on  the bottom of your toes and/or foot. ?Hold this stretch for 10 seconds. ?Now, gently pull your toes toward the bottom of your foot. You should feel a stretch on the top of your toes and or foot. ?Hold this stretch for 10 seconds. ?Repeat  times. Complete this stretch 3 times per day.  ? ?RANGE OF MOTION - Ankle Dorsiflexion, Active Assisted ?Remove shoes and sit on a chair that is preferably not on a carpeted surface. ?Place right / left foot under knee. Extend your opposite leg for support. ?Keeping your heel down, slide your right / left foot back toward the chair until you feel a stretch at your ankle or calf. If you do not feel a stretch, slide your bottom forward to the edge of the chair, while still keeping your heel down. ?Hold this stretch for 10 seconds. ?Repeat 3 times. Complete this stretch 2 times per day.  ? ?STRETCH  Gastroc, Standing ?Place hands on wall. ?Extend right / left leg, keeping the front knee somewhat bent. ?Slightly point your toes inward on your back foot. ?Keeping your right / left heel on the floor and your knee straight, shift your weight toward the wall, not allowing your back to arch. ?You should feel a gentle stretch in the right / left calf. Hold this position for 10 seconds. ?Repeat 3 times. Complete this stretch 2 times per day. ? ?STRETCH  Soleus, Standing ?Place hands on wall. ?Extend right / left leg, keeping   the other knee somewhat bent. ?Slightly point your toes inward on your back foot. ?Keep your right / left heel on the floor, bend your back knee, and slightly shift your weight over the back leg so that you feel a gentle stretch deep in your back calf. ?Hold this position for 10 seconds. ?Repeat 3 times. Complete this stretch 2 times per day. ? ?STRETCH  Gastrocsoleus, Standing  ?Note: This exercise can place a lot of stress on your foot and ankle. Please complete this exercise only if specifically instructed by your caregiver.  ?Place the ball of your right / left foot  on a step, keeping your other foot firmly on the same step. ?Hold on to the wall or a rail for balance. ?Slowly lift your other foot, allowing your body weight to press your heel down over the edge of the s

## 2022-03-03 NOTE — Progress Notes (Signed)
Subjective: ?56 year old male presents the office today for follow-up evaluation of right heel pain, plantar fasciitis.  Still gets discomfort but is not consistent.  He states that he has been taking physical therapy which is helpful.  Takes meloxicam as needed.  He still has an area on the bottom of the heel is 2 specific spots that cause discomfort.  He is asking if this could be the bone spur.  No recent injuries or changes otherwise.  No other concerns. ? ?Objective: ?AAO x3, NAD ?DP/PT pulses palpable bilaterally, CRT less than 3 seconds ?Thre is tenderness to palpation along the plantar medial tubercle of calcaneus at the insertion of plantar fascia on the right side.  There are 2 specific spots that are localized that cause discomfort. There is no pain with compression of calcaneus.  No Tinel sign.  No pain in the Achilles tendon.  Flexor, extensor tendons are intact.  MMT 5/5. ?No pain with calf compression, swelling, warmth, erythema ? ?Assessment: ?Right heel pain, Plantar fasciitis ? ?Plan: ?-All treatment options discussed with the patient including all alternatives, risks, complications.  ?-Discussed steroid injection was to proceed with this today.  Cleansed with alcohol and a mixture of half cc dexamethasone phosphate, half cc Kenalog 10, half cc Marcaine plain, half cc lidocaine plain was infiltrated into the area of maximal tenderness without complications.  Postinjection care discussed.  Tolerated well. ?-Continue stretching, icing on a regular basis as well as wearing shoes with good arch support which we can discuss.  With ?-I reviewed the prior x-ray.  Heel spur small and as the pain is not continuous I do not think it is the plantar spur. ?-Overall has been improving.  However at this point will refer to physical therapy.  Prescription was [facility] for [facility] and elected to also include dry needling.  Continue with supportive shoe gears and home stretching for now.  Refill  meloxicam to use as needed. ?-Patient encouraged to call the office with any questions, concerns, change in symptoms.  ? ?Vivi Barrack DPM ? ? ? ?

## 2022-03-08 ENCOUNTER — Ambulatory Visit: Payer: No Typology Code available for payment source | Admitting: Podiatry

## 2022-04-04 ENCOUNTER — Other Ambulatory Visit: Payer: Self-pay | Admitting: *Deleted

## 2022-04-04 DIAGNOSIS — I7121 Aneurysm of the ascending aorta, without rupture: Secondary | ICD-10-CM

## 2022-04-06 ENCOUNTER — Other Ambulatory Visit: Payer: Self-pay | Admitting: Podiatry

## 2022-05-06 ENCOUNTER — Other Ambulatory Visit: Payer: Self-pay | Admitting: Podiatry

## 2022-05-07 NOTE — Telephone Encounter (Signed)
Please advise 

## 2022-05-14 ENCOUNTER — Encounter: Payer: Self-pay | Admitting: Physician Assistant

## 2022-05-14 ENCOUNTER — Ambulatory Visit: Payer: No Typology Code available for payment source | Admitting: Physician Assistant

## 2022-05-14 ENCOUNTER — Ambulatory Visit
Admission: RE | Admit: 2022-05-14 | Discharge: 2022-05-14 | Disposition: A | Discharge status: Home / Self Care | Payer: No Typology Code available for payment source | Source: Ambulatory Visit | Attending: Surgery | Admitting: Surgery

## 2022-05-14 VITALS — BP 127/85 | HR 64 | Resp 20 | Wt 202.0 lb

## 2022-05-14 DIAGNOSIS — I7121 Aneurysm of the ascending aorta, without rupture: Secondary | ICD-10-CM

## 2022-05-14 MED ORDER — IOPAMIDOL (ISOVUE-370) INJECTION 76%
75.0000 mL | Freq: Once | INTRAVENOUS | Status: AC | PRN
  Administered 2022-05-14: 75 mL via INTRAVENOUS
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
## 2022-05-14 NOTE — Patient Instructions (Signed)
Avoid prolonged strenuous activity.  Okay to participate in cardiovascular exercises  Follow-up in 18 months with repeat CTA chest

## 2022-10-26 ENCOUNTER — Other Ambulatory Visit: Payer: Self-pay | Admitting: Podiatry

## 2022-12-05 ENCOUNTER — Other Ambulatory Visit: Payer: Self-pay | Admitting: Podiatry

## 2022-12-05 DIAGNOSIS — M722 Plantar fascial fibromatosis: Secondary | ICD-10-CM

## 2023-01-05 ENCOUNTER — Other Ambulatory Visit: Payer: Self-pay | Admitting: Podiatry

## 2023-01-05 DIAGNOSIS — M722 Plantar fascial fibromatosis: Secondary | ICD-10-CM

## 2023-01-18 ENCOUNTER — Ambulatory Visit: Payer: No Typology Code available for payment source | Admitting: Podiatry

## 2023-01-18 ENCOUNTER — Ambulatory Visit (INDEPENDENT_AMBULATORY_CARE_PROVIDER_SITE_OTHER): Payer: No Typology Code available for payment source

## 2023-01-18 VITALS — BP 119/75 | HR 55

## 2023-01-18 DIAGNOSIS — M722 Plantar fascial fibromatosis: Secondary | ICD-10-CM

## 2023-01-18 MED ORDER — TRIAMCINOLONE ACETONIDE 10 MG/ML IJ SUSP
10.0000 mg | Freq: Once | INTRAMUSCULAR | Status: AC
  Administered 2023-01-18: 10 mg

## 2023-01-18 NOTE — Progress Notes (Signed)
Subjective: Chief Complaint  Patient presents with   Foot Pain    Right foot plantar fasciitis. Patient is stretching. Injection given previously was effective.    57 year old male presents For above concerns.  States he has been doing well however the pain is moved down towards the plantar lateral aspect points to.  No injuries that he reports.  No numbness or tingling.  Still been stretching, icing.  This propelled self inserts.  Objective: AAO x3, NAD DP/PT pulses palpable bilaterally, CRT less than 3 seconds Tenderness to palpation along the plantar LATERAL tubercle of the calcaneus at the insertion of plantar fascia on the right foot. There is no pain along the course of the plantar fascia within the arch of the foot. Plantar fascia appears to be intact. There is no pain with lateral compression of the calcaneus or pain with vibratory sensation. There is no pain along the course or insertion of the achilles tendon. No other areas of tenderness to bilateral lower extremities.  Negative Tinel sign No pain with calf compression, swelling, warmth, erythema  Assessment: Right heel pain, Plantar fasciitis  Plan: -All treatment options discussed with the patient including all alternatives, risks, complications.  -X-rays were obtained reviewed.  3 views of the foot were obtained.  Calcaneal spurring is present.  No evidence of acute fracture. -Steroid injection performed today.  See procedure note below. -Continue stretching, icing on regular basis as well as shoes and good arch support.  Continue power step inserts. -Discussed that symptoms persist EPAT as he did well on the left side. -Patient encouraged to call the office with any questions, concerns, change in symptoms.   Procedure: Injection Tendon/Ligament Discussed alternatives, risks, complications and verbal consent was obtained.  Location: Right plantar fascia at the glabrous junction; medial approach. Skin Prep:  Alcohol. Injectate: 0.5cc 0.5% marcaine plain, 0.5 cc 2% lidocaine plain and, 1 cc kenalog 10. Disposition: Patient tolerated procedure well. Injection site dressed with a band-aid.  Post-injection care was discussed and return precautions discussed.   Return if symptoms worsen or fail to improve.  Trula Slade DPM

## 2023-01-18 NOTE — Patient Instructions (Signed)

## 2023-02-19 ENCOUNTER — Other Ambulatory Visit: Payer: Self-pay | Admitting: Internal Medicine

## 2023-02-19 ENCOUNTER — Ambulatory Visit
Admission: RE | Admit: 2023-02-19 | Discharge: 2023-02-19 | Disposition: A | Discharge status: Home / Self Care | Payer: No Typology Code available for payment source | Source: Ambulatory Visit | Attending: Internal Medicine | Admitting: Internal Medicine

## 2023-02-19 DIAGNOSIS — M542 Cervicalgia: Secondary | ICD-10-CM

## 2023-02-19 DIAGNOSIS — R1319 Other dysphagia: Secondary | ICD-10-CM

## 2023-07-05 ENCOUNTER — Encounter: Payer: Self-pay | Admitting: Podiatry

## 2023-07-05 ENCOUNTER — Ambulatory Visit: Payer: No Typology Code available for payment source | Admitting: Podiatry

## 2023-07-05 DIAGNOSIS — M722 Plantar fascial fibromatosis: Secondary | ICD-10-CM | POA: Diagnosis not present

## 2023-07-05 MED ORDER — TRIAMCINOLONE ACETONIDE 10 MG/ML IJ SUSP
10.0000 mg | Freq: Once | INTRAMUSCULAR | Status: AC
  Administered 2023-07-05: 10 mg via INTRA_ARTICULAR

## 2023-07-07 NOTE — Progress Notes (Signed)
Subjective:   Patient ID: Jonathan Joseph, male   DOB: 57 y.o.   MRN: [id number]   HPI Patient states his heel is hurting him again and he only got several months of relief followed by reoccurrence   ROS      Objective:  Physical Exam  Neurovascular status intact with exquisite discomfort noted plantar aspect right heel at the insertional point of the tendon into the calcaneus with inflammation fluid noted around the insertional point of the tendon at     Assessment:  Acute plantar fasciitis right that is been present for a long time also with chronic nature that is inflamed at the insertion     Plan:  H&P reviewed condition and at this point I do think he is getting need at 1 point consider a more aggressive approach and I discussed that with him.  I want to see how much relief for able to get today I did go ahead I did sterile prep most the pain is on the center lateral portion of the tendon so I injected from the lateral side 3 mg Kenalog 5 mg Xylocaine tolerated well

## 2023-10-02 ENCOUNTER — Ambulatory Visit: Payer: No Typology Code available for payment source | Admitting: Podiatry

## 2023-10-02 ENCOUNTER — Encounter: Payer: Self-pay | Admitting: Podiatry

## 2023-10-02 DIAGNOSIS — M722 Plantar fascial fibromatosis: Secondary | ICD-10-CM

## 2023-10-02 MED ORDER — TRIAMCINOLONE ACETONIDE 10 MG/ML IJ SUSP
10.0000 mg | Freq: Once | INTRAMUSCULAR | Status: AC
  Administered 2023-10-02: 10 mg via INTRA_ARTICULAR

## 2023-10-02 MED ORDER — PREDNISONE 10 MG PO TABS: ORAL_TABLET | ORAL | 0 refills | Status: DC

## 2023-10-02 NOTE — Progress Notes (Signed)
Subjective:   Patient ID: Jonathan Joseph, male   DOB: 57 y.o.   MRN: [id number]   HPI Patient states he has 1 spot on the outside bottom of his right heel that simply drives him crazy with everything else doing well neurovascular   ROS      Objective:  Physical Exam  Status intact with an inflamed area on the plantar lateral aspect of the right heel very sore that is worse when he tries to be active     Assessment:  Most likely a compensatory fasciitis right with numerous questions today that did not seem to indicate that this was a nerve compression or related to a back issue     Plan:  H&P reviewed careful injection of the lateral side 3 mg Kenalog 5 mg Xylocaine advised on boot usage and dispensed air fracture walker properly fitted to his lower leg to offload all weight off the bottom of the heel.  Reappoint to recheck

## 2023-10-04 ENCOUNTER — Ambulatory Visit: Payer: No Typology Code available for payment source | Admitting: Podiatry

## 2023-10-07 ENCOUNTER — Other Ambulatory Visit: Payer: Self-pay | Admitting: Surgery

## 2023-10-07 DIAGNOSIS — I7121 Aneurysm of the ascending aorta, without rupture: Secondary | ICD-10-CM

## 2023-10-14 ENCOUNTER — Encounter: Payer: Self-pay | Admitting: Surgery
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
## 2023-10-29 NOTE — Patient Instructions (Signed)
Patient is counseled regarding the importance of long term risk factor modification as they pertain to the presence of ischemic heart disease including avoiding the use of all tobacco products, dietary modifications and medical therapy for diabetes, cholesterol and lipid management, and regular exercise.     AVOID FLOUROQUINOLONES (ex: Cipro) AS THESE CAN INCREASE YOUR RISK OF AORTIC DISSECTION

## 2023-10-30 ENCOUNTER — Ambulatory Visit: Payer: No Typology Code available for payment source | Admitting: Podiatry

## 2023-10-30 ENCOUNTER — Encounter: Payer: Self-pay | Admitting: Podiatry

## 2023-10-30 DIAGNOSIS — M722 Plantar fascial fibromatosis: Secondary | ICD-10-CM

## 2023-10-30 MED ORDER — TRIAMCINOLONE ACETONIDE 10 MG/ML IJ SUSP
10.0000 mg | Freq: Once | INTRAMUSCULAR | Status: AC
  Administered 2023-10-30: 10 mg via INTRA_ARTICULAR

## 2023-10-30 NOTE — Progress Notes (Signed)
Subjective:   Patient ID: Jonathan Joseph, male   DOB: 57 y.o.   MRN: [id number]   HPI Patient presents with a lot of pain in the outside bottom of the right heel that still present that he was not able to do well with the boot.  States the other pain seems better it is not bad when he gets up in the morning as after he has been on it for a while   ROS      Objective:  Physical Exam  Neuro vascular status intact with inflammation pain of the plantar lateral aspect of the right heel     Assessment:  Probability for compensatory fascial inflammation that so far has not responded conservatively     Plan:  H&P reviewed I dispensed night splint with ice packs with instructions on usage did a careful injection of the lateral side 3 mg dexamethasone Kenalog 5 mg Xylocaine I advised on PRP injection if symptoms do not improve with this treatment or possible shockwave someday

## 2023-11-04 ENCOUNTER — Ambulatory Visit
Admission: RE | Admit: 2023-11-04 | Discharge: 2023-11-04 | Disposition: A | Discharge status: Home / Self Care | Payer: No Typology Code available for payment source | Source: Ambulatory Visit | Attending: Surgery | Admitting: Surgery

## 2023-11-04 DIAGNOSIS — I7121 Aneurysm of the ascending aorta, without rupture: Secondary | ICD-10-CM

## 2023-11-04 MED ORDER — IOPAMIDOL (ISOVUE-370) INJECTION 76%
500.0000 mL | Freq: Once | INTRAVENOUS | Status: AC | PRN
  Administered 2023-11-04: 75 mL via INTRAVENOUS

## 2023-11-11 ENCOUNTER — Other Ambulatory Visit: Payer: No Typology Code available for payment source

## 2023-11-11 ENCOUNTER — Ambulatory Visit: Payer: No Typology Code available for payment source

## 2023-11-11 VITALS — BP 138/83 | HR 72 | Resp 20 | Wt 208.0 lb

## 2023-11-11 DIAGNOSIS — I7121 Aneurysm of the ascending aorta, without rupture: Secondary | ICD-10-CM | POA: Diagnosis not present

## 2023-11-13 ENCOUNTER — Ambulatory Visit: Payer: No Typology Code available for payment source | Admitting: Podiatry

## 2023-11-13 DIAGNOSIS — G629 Polyneuropathy, unspecified: Secondary | ICD-10-CM

## 2023-11-13 DIAGNOSIS — M722 Plantar fascial fibromatosis: Secondary | ICD-10-CM | POA: Diagnosis not present

## 2023-11-13 MED ORDER — GABAPENTIN 300 MG PO CAPS: 300.0000 mg | ORAL_CAPSULE | Freq: Three times a day (TID) | ORAL | 3 refills | Status: AC
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
## 2023-12-25 ENCOUNTER — Ambulatory Visit: Payer: No Typology Code available for payment source | Admitting: Podiatry
